# Patient Record
Sex: Female | Born: 1985 | Race: White | Hispanic: Yes | Marital: Married | Smoking: Never smoker
Health system: Southern US, Community
[De-identification: ages and names within clinical notes are randomized; demographics above are authoritative.]

## PROBLEM LIST (undated history)

## (undated) DIAGNOSIS — O24419 Gestational diabetes mellitus in pregnancy, unspecified control: Secondary | ICD-10-CM

## (undated) DIAGNOSIS — E669 Obesity, unspecified: Secondary | ICD-10-CM

## (undated) DIAGNOSIS — G43909 Migraine, unspecified, not intractable, without status migrainosus: Secondary | ICD-10-CM

## (undated) HISTORY — DX: Migraine, unspecified, not intractable, without status migrainosus: G43.909

## (undated) HISTORY — DX: Gestational diabetes mellitus in pregnancy, unspecified control: O24.419

## (undated) HISTORY — DX: Obesity, unspecified: E66.9

---

## 2009-05-27 ENCOUNTER — Encounter: Payer: Self-pay | Admitting: Family Medicine

## 2009-05-27 ENCOUNTER — Ambulatory Visit: Payer: Self-pay | Admitting: Family Medicine

## 2009-05-27 LAB — CONVERTED CEMR LAB
Basophils Absolute: 0 10*3/uL (ref 0.0–0.1)
Basophils Relative: 0 % (ref 0–1)
Hemoglobin: 11.5 g/dL — ABNORMAL LOW (ref 12.0–15.0)
Hepatitis B Surface Ag: NEGATIVE
Lymphocytes Relative: 26 % (ref 12–46)
MCHC: 32.3 g/dL (ref 30.0–36.0)
Monocytes Absolute: 0.5 10*3/uL (ref 0.1–1.0)
Neutro Abs: 4.9 10*3/uL (ref 1.7–7.7)
Neutrophils Relative %: 64 % (ref 43–77)
Platelets: 246 10*3/uL (ref 150–400)
RDW: 13.7 % (ref 11.5–15.5)
Rh Type: POSITIVE

## 2009-06-03 ENCOUNTER — Encounter: Payer: Self-pay | Admitting: Family Medicine

## 2009-06-03 ENCOUNTER — Ambulatory Visit: Payer: Self-pay | Admitting: Family Medicine

## 2009-06-03 LAB — CONVERTED CEMR LAB: Chlamydia, DNA Probe: NEGATIVE

## 2009-07-06 ENCOUNTER — Ambulatory Visit: Payer: Self-pay | Admitting: Family Medicine

## 2009-07-26 ENCOUNTER — Encounter: Payer: Self-pay | Admitting: Family Medicine

## 2009-08-05 ENCOUNTER — Ambulatory Visit: Payer: Self-pay | Admitting: Family Medicine

## 2009-08-18 ENCOUNTER — Encounter: Payer: Self-pay | Admitting: Family Medicine

## 2009-08-20 ENCOUNTER — Encounter: Payer: Self-pay | Admitting: Family Medicine

## 2009-09-07 ENCOUNTER — Ambulatory Visit: Payer: Self-pay | Admitting: Family Medicine

## 2009-09-07 ENCOUNTER — Encounter: Payer: Self-pay | Admitting: Family Medicine

## 2009-09-07 LAB — CONVERTED CEMR LAB

## 2009-09-13 ENCOUNTER — Ambulatory Visit: Payer: Self-pay | Admitting: Family Medicine

## 2009-09-13 ENCOUNTER — Encounter: Payer: Self-pay | Admitting: Family Medicine

## 2009-09-13 ENCOUNTER — Encounter: Payer: Self-pay | Admitting: *Deleted

## 2009-09-28 ENCOUNTER — Encounter: Payer: Self-pay | Admitting: Family Medicine

## 2009-09-28 ENCOUNTER — Ambulatory Visit (HOSPITAL_COMMUNITY): Admission: RE | Admit: 2009-09-28 | Discharge: 2009-09-28 | Payer: Self-pay | Admitting: Family Medicine

## 2009-09-30 ENCOUNTER — Ambulatory Visit: Payer: Self-pay | Admitting: Obstetrics & Gynecology

## 2009-10-14 ENCOUNTER — Ambulatory Visit: Payer: Self-pay | Admitting: Family Medicine

## 2009-10-28 ENCOUNTER — Ambulatory Visit: Payer: Self-pay | Admitting: Family Medicine

## 2009-11-08 ENCOUNTER — Ambulatory Visit: Payer: Self-pay | Admitting: Family Medicine

## 2009-11-17 ENCOUNTER — Ambulatory Visit: Payer: Self-pay | Admitting: Family Medicine

## 2009-11-22 ENCOUNTER — Ambulatory Visit: Payer: Self-pay | Admitting: Family Medicine

## 2009-11-24 ENCOUNTER — Inpatient Hospital Stay (HOSPITAL_COMMUNITY): Admission: AD | Admit: 2009-11-24 | Discharge: 2009-11-26 | Payer: Self-pay | Admitting: Obstetrics & Gynecology

## 2009-11-24 ENCOUNTER — Ambulatory Visit: Payer: Self-pay | Admitting: Family

## 2010-01-04 ENCOUNTER — Ambulatory Visit: Payer: Self-pay | Admitting: Family Medicine

## 2010-05-05 ENCOUNTER — Ambulatory Visit: Payer: Self-pay | Admitting: Family Medicine

## 2010-05-05 DIAGNOSIS — L03119 Cellulitis of unspecified part of limb: Secondary | ICD-10-CM

## 2010-05-05 DIAGNOSIS — L02419 Cutaneous abscess of limb, unspecified: Secondary | ICD-10-CM | POA: Insufficient documentation

## 2010-10-30 NOTE — L&D Delivery Note (Signed)
Delivery Note At 3:48 PM a viable female was delivered via Vaginal, Spontaneous Delivery (Presentation: OA ).  APGAR: 9, 9; weight 7 lb (3175 g).   Placenta status: Intact, Spontaneous.  Cord: 3 vessels with the following complications: None.   Anesthesia: None  Episiotomy: None Lacerations: None Est. Blood Loss (mL): 100  Mom to postpartum.  Baby to nursery-stable.  Alyssa [name] JEHIEL 07/26/2011, 4:10 PM

## 2010-11-29 NOTE — Assessment & Plan Note (Signed)
Summary: OB/KH   Vital Signs:  Patient profile:   25 year old female Weight:      198.31 pounds BP sitting:   112 / 77  (left arm) CC: 37 4/7 OB check Is Patient Diabetic? No Pain Assessment Patient in pain? no        CC:  37 4/7 OB check.  Habits & Providers  Alcohol-Tobacco-Diet     Tobacco Status: never     Cigarette Packs/Day: n/a  Allergies: No Known Drug Allergies   Impression & Recommendations:  Problem # 1:  SUPERVISION OF NORMAL FIRST PREGNANCY (ICD-V22.0)  Doing well.  Labor precautions and kick counts discussed.  RTC in 1 week.  Orders: Other OB visit- [facility] (OBCK)  Complete Medication List: 1)  P D Natal Vitamins/folic Acid Tabs (Prenatal multivit-min-fe-fa) .... One tab per day by mouth (ins in spanish) 2)  Colace 100 Mg Caps (Docusate sodium) .Marland Kitchen.. 1 pill by mouth 2 times a day  Patient Instructions: 1)  It was a pleasure seeing you today. 2)  Please make a follow up OB appointment in 1 week with Dr. Denyse Amass or Dr. Claudius Sis if he is not available. 3)  You are now considered "Full Term." 4)  If you have regular contractions every 5-10 minutes that continue for 1 hour or your bag of water breaks, come to the MAU at [facility]. 5)  Continue to do your kick counts (10 baby movements in 4 hours) once a day.  If your baby seems to be moving less than that, come to MAU.  Prenatal Visit Concerns noted: Patient is here today for return OB visit at 37 4/7weeks. She denies ctx, LOF or VB and reports + FM.  She c/o mild URI, denies fever.  Labor precautions discussed. GBS negative. EDC Confirmation: Ultrasound Dating Information:    Gest age by current sono: 37W 4D    EDC by current sono: 12/04/2009    Flowsheet View for Follow-up Visit    Estimated weeks of       gestation:     37 4/7    Weight:     198.31    Blood pressure:   112 / 77    Hx headache?     No    Nausea/vomiting?   No    Edema?     0    Bleeding?     no    Leakage/discharge?   d/c    Fetal activity:       yes    Labor symptoms?   no    Fundal height:      37    FHR:       140    Fetal position:      vertex    Taking Vitamins?   Y    Smoking PPD:   n/a    Next visit:     1 wk    Resident:     Claudius Sis

## 2010-11-29 NOTE — Assessment & Plan Note (Signed)
Summary: GBS,OB ,MC   Vital Signs:  Patient profile:   25 year old female Weight:      197.3 pounds Temp:     98.7 degrees F oral Pulse rate:   83 / minute BP sitting:   119 / 79  (left arm) Cuff size:   regular  Vitals Entered By: Alyssa [name] (November 08, 2009 9:38 AM) CC: OB 36-2 Is Patient Diabetic? No Pain Assessment Patient in pain? no        Primary Care Provider:  Clementeen Graham  CC:  OB 36-2.  History of Present Illness: Alyssa [name] is a 26 yo G1P0 at 36.2 weeks who presents today for a routine prenatal check.  She is doing well with no complaints.  No headache, vision change, rapid weight gain or swelling, contractions, vaginal bleeding.  She can feel the baby move. She is having normal for her small amounts of white vaginal discharge.    Please see Ob flowsheet for further details.  Habits & Providers  Alcohol-Tobacco-Diet     Tobacco Status: never     Cigarette Packs/Day: n/a  Problems Prior to Update: 1)  Supervision of Normal First Pregnancy  (ICD-V22.0) 2)  Abnormal Maternal Glucose Tolerance Antepartum  (ZOX-096.04) 3)  Fetal/placental Complication, Antepartum  (VWU-981.19) 4)  Need Prophylactic Vaccination&inoculation Flu  (ICD-V04.81) 5)  Pregnant State, Incidental  (ICD-V22.2)  Current Problems (verified): 1)  Supervision of Normal First Pregnancy  (ICD-V22.0) 2)  Abnormal Maternal Glucose Tolerance Antepartum  (JYN-829.56) 3)  Fetal/placental Complication, Antepartum  (OZH-086.57) 4)  Need Prophylactic Vaccination&inoculation Flu  (ICD-V04.81) 5)  Pregnant State, Incidental  (ICD-V22.2)  Medications Prior to Update: 1)  P D Natal Vitamins/folic Acid  Tabs (Prenatal Multivit-Min-Fe-Fa) .... One Tab Per Day By Mouth (Ins in Spanish) 2)  Colace 100 Mg Caps (Docusate Sodium) .Marland Kitchen.. 1 Pill By Mouth 2 Times A Day  Current Medications (verified): 1)  P D Natal Vitamins/folic Acid  Tabs (Prenatal Multivit-Min-Fe-Fa) .... One Tab Per Day By Mouth (Ins in  Spanish) 2)  Colace 100 Mg Caps (Docusate Sodium) .Marland Kitchen.. 1 Pill By Mouth 2 Times A Day  Allergies (verified): No Known Drug Allergies  Past History:  Past Medical History: Last updated: 09/07/2009 Pregnant, and US showing a possible subchorionic hematoma.   Past Surgical History: Last updated: 06/03/2009 none  Social History: Last updated: 06/03/2009 Live in [place] for one year from [place].  Not working.  She takes care of a baby at home.  Lives with partner who is the FOB.   Risk Factors: Smoking Status: never (11/08/2009) Packs/Day: n/a (11/08/2009)  Review of Systems        Please see Ob flowsheet for further details. Otherwise neg with the exception of HPI.  Physical Exam  General:  VS noted. Well appearing gravid woman in NAD Lungs:  CTABL Heart:  RRR no MRG Abdomen:  Gravid measuring 57m.  Nontender. Extremities:  Non edemetus BL Psych:  normally interactive, good eye contact, and not anxious appearing.   Additional Exam:   Please see Ob flowsheet for further details.   Impression & Recommendations:  Problem # 1:  SUPERVISION OF NORMAL FIRST PREGNANCY (ICD-V22.0) Assessment Unchanged 25 yo G1P0 at 36.2.  No issues at this visit.  GBS (pt has no PCN allergies), and GC/CL obtained.  Will f/u in 1 week.   Will print summery once GC/CL and GBS are back and fax to MAU.    Orders: Grp B Probe-[facility] ([id number]) GC/Chlamydia-[facility] ([id number]) Other OB visit-  [facility] (OBCK)  Complete Medication List: 1)  P D Natal Vitamins/folic Acid Tabs (Prenatal multivit-min-fe-fa) .... One tab per day by mouth (ins in spanish) 2)  Colace 100 Mg Caps (Docusate sodium) .Marland Kitchen.. 1 pill by mouth 2 times a day  Patient Instructions: 1)  Thank you for seeing me today. 2)  Cada manana y noche enfoquese en los movimientos del bebe.  Si siente por lo  menos cinco movimientos en una hora, no tiene que seguir contando.  Si no llega a cinco movimientos en una hora, siga contando por otra hora.  Si  no llega a diez movimientos en estas dos horas, debe presentarse al [employer] de [facility]gencia de [facility] o llamar a Event organiser. 3)  Si tiene sangrado vaginal, o si sospecha que se le ha roto la fuente (chorro fuerte o un goteo leve de la vagina) o si tiene contracciones que vienen mas de cada cinco minutos por dos horas seguidas, por favor presentese al Narda Rutherford de Emergencia de [facility].    [facility] for Follow-up Visit    Estimated weeks of       gestation:     36 2/7    Weight:     197.3    Blood pressure:   119 / 79    Headache:     No    Nausea/vomiting:   No    Edema:     0    Vaginal bleeding:   no    Vaginal discharge:   d/c    Fundal height:      35    FHR:       135    Fetal activity:     yes    Labor symptoms:   no    Fetal position:     vertex    Cx Dilation:     FT    Cx Effacement:   20%    Cx Station:     high    Taking prenatal vits?   Y    Smoking:     n/a    Next visit:     1 wk    Resident:     Denyse Amass    Preceptor:     Mauricio Po

## 2010-11-29 NOTE — Assessment & Plan Note (Signed)
Summary: bug bite?/eo   Vital Signs:  Patient profile:   25 year old female Weight:      199 pounds Temp:     98.0 degrees F oral Pulse rate:   82 / minute BP sitting:   119 / 78  (left arm) Cuff size:   large  Vitals Entered By: Tessie Fass CMA (May 05, 2010 10:12 AM) CC: bug bite?   Primary Provider:  Clementeen Graham  CC:  bug bite?Marland Kitchen  History of Present Illness: 1. Bug Bite- Patient with states she was bitten by bug 4 days ago on the back side of her L lower leg.  Did not see bug, but felt a pinch and sting when it occurred.  Has had gradual redness, swelling and pain over the past few days and states the area is warm to the touch.  She has had no drainage from the area.  She denies fever, chills, malaise, headaches, rash or lesions anywhere else on body.  Current Medications (verified): 1)  Sprintec 28 0.25-35 Mg-Mcg Tabs (Norgestimate-Eth Estradiol) .Marland Kitchen.. 1 By Mouth Daily (Spanish) 2)  Bactrim Ds 800-160 Mg Tabs (Sulfamethoxazole-Trimethoprim) .Marland Kitchen.. 1 Tab By Mouth Two Times A Day For 10 Days  Allergies (verified): No Known Drug Allergies  Review of Systems       Pertinent positives and negatives noted in HPI, Vitals signs noted   Physical Exam  General:  Obese hispanic female in NAD Lungs:  normal respiratory effort and normal breath sounds.   Heart:  normal rate, regular rhythm, no murmur, no gallop, and no rub.   Msk:  normal ROM, no joint tenderness, no joint swelling, and no joint warmth of L knee Skin:  Large erythematous patch on upper half of posterior calf.  There is a punctate lesion present in the middle of the area. The area is warm to the touch. There is no drainage or fluctuance.   Impression & Recommendations:  Problem # 1:  CELLULITIS, LEG, LEFT (ICD-682.6)  Erythematous area on calf concerning for cellulitis, given Rx for bactrim.  Would like to see her back in a week but patient states that since she pays out of pocket she does not want to come back if  it is getting better. Instructed to return if having fevers or chills or area is getting worse or not improving. Her updated medication list for this problem includes:    Bactrim Ds 800-160 Mg Tabs (Sulfamethoxazole-trimethoprim) .Marland Kitchen... 1 tab by mouth two times a day for 10 days  Orders: [facility]- Est Level  3 (16109)  Complete Medication List: 1)  Sprintec 28 0.25-35 Mg-mcg Tabs (Norgestimate-eth estradiol) .Marland Kitchen.. 1 by mouth daily (spanish) 2)  Bactrim Ds 800-160 Mg Tabs (Sulfamethoxazole-trimethoprim) .Marland Kitchen.. 1 tab by mouth two times a day for 10 days  Patient Instructions: 1)  Patient given verbal instructions through translator that if not getting better or getting worse to please call back to be seen again.  Also call back if having fevers or chills or drainage from area.  Patient expressed understanding of these instructions Prescriptions: BACTRIM DS 800-160 MG TABS (SULFAMETHOXAZOLE-TRIMETHOPRIM) 1 tab by mouth two times a day for 10 days  #20 x 0   Entered and Authorized by:   Everrett Coombe DO   Signed by:   Everrett Coombe DO on 05/05/2010   Method used:   Print then Give to Patient   RxID:   [id number] BACTRIM DS 800-160 MG TABS (SULFAMETHOXAZOLE-TRIMETHOPRIM) 1 tab by mouth two times a  day for 10 days  #20 x 0   Entered and Authorized by:   Everrett Coombe DO   Signed by:   Everrett Coombe DO on 05/05/2010   Method used:   Print then Give to Patient   RxID:   [id number]

## 2010-11-29 NOTE — Assessment & Plan Note (Signed)
Summary: 6 wk postpartum/kh   Vital Signs:  Patient profile:   25 year old female Weight:      187.7 pounds Temp:     98 degrees F oral Pulse rate:   75 / minute BP sitting:   105 / 68  (left arm) Cuff size:   large  Vitals Entered By: Loralee Pacas CMA (January 04, 2010 2:09 PM)  Primary Care Provider:  Clementeen Graham  CC:  Post Partum check.  History of Present Illness: Ms R-V has been doing very well since the birth of her child.  She is happy and feels a great bond between her and her infant.  She is mostly bottle feeding and is only occasionally breast feeding.  She does not think that she will continue to breastfeed. She notes that her vaginal bleeding after her birth has lessened.  She is exepriencing occasional spotting. currently.   Askes about a "bump" on her introitus.  Non-painful.  No isses.   Current Problems (verified): 1)  Pregnant State, Incidental  (ICD-V22.2)  Current Medications (verified): 1)  Sprintec 28 0.25-35 Mg-Mcg Tabs (Norgestimate-Eth Estradiol) .Marland Kitchen.. 1 By Mouth Daily (Spanish)  Allergies (verified): No Known Drug Allergies  Past History:  Past Surgical History: Last updated: 06/03/2009 none  Social History: Last updated: 06/03/2009 Live in [place] for one year from [place].  Not working.  She takes care of a baby at home.  Lives with partner who is the FOB.   Risk Factors: Smoking Status: never (11/17/2009) Packs/Day: n/a (11/22/2009)  Past Medical History: 1st pregnancy complicated by a US showing a possible subchorionic hematoma.  Which resolved prior to delivery.  Family History: Reviewed history from 06/03/2009 and no changes required. None   Social History: Reviewed history from 06/03/2009 and no changes required. Live in [place] for one year from [place].  Not working.  She takes care of a baby at home.  Lives with partner who is the FOB.   Review of Systems       Normal lochia. Otherwise negative   Physical Exam  General:  VS  noted. Well appearing NAD. Lungs:  CTABL Heart:  RRR no MRG Abdomen:  Non distended, NABS, soft, nontender, no guarding or rebound.   Genitalia:  Normal external female genitalia. Area of hyminal ring enlarged, non-erythemetus, non-tender.  When asked Ms R-V states this the area she noted. Cervix is WNL.   Uterus is WNL on bimanual exam. No adanexal masses or tenderness noted.    Impression & Recommendations:  Problem # 1:  PREGNANT STATE, INCIDENTAL (ICD-V22.2)  6 week check WNL.  Pt concerned about redundant tissue following laceration repair.   Pt would also like contraception in the form of OCPs.  She is not breastfeeding significantly.   Plan:  Sprintec for 12 months. Will see patient in 1 year or sooner if needed.  Will repeat pap smear and follow up external genitals.    Orders: Postpartum visit- [facility] (16109)  Complete Medication List: 1)  Sprintec 28 0.25-35 Mg-mcg Tabs (Norgestimate-eth estradiol) .Marland Kitchen.. 1 by mouth daily (spanish) Prescriptions: SPRINTEC 28 0.25-35 MG-MCG TABS (NORGESTIMATE-ETH ESTRADIOL) 1 by mouth daily (spanish)  #28 x 12   Entered and Authorized by:   Clementeen Graham MD   Signed by:   Clementeen Graham MD on 01/04/2010   Method used:   Print then Give to Patient   RxID:   [id number] SPRINTEC 28 0.25-35 MG-MCG TABS (NORGESTIMATE-ETH ESTRADIOL)   #28 x 12   Entered and  Authorized by:   Clementeen Graham MD   Signed by:   Clementeen Graham MD on 01/04/2010   Method used:   Print then Give to Patient   RxID:   [id number]

## 2010-11-29 NOTE — Assessment & Plan Note (Signed)
Summary: OB/KH   Vital Signs:  Patient profile:   25 year old female Weight:      199 pounds Temp:     98.6 degrees F oral Pulse rate:   63 / minute Pulse rhythm:   regular BP sitting:   116 / 76  (left arm) Cuff size:   large  Vitals Entered By: Loralee Pacas CMA (November 22, 2009 9:12 AM) CC: [facility]   Primary Care Provider:  Clementeen Graham  CC:  PNC.  History of Present Illness: Ms Robles-Vargas presents at 38.2 weeks for a normal well PNC visit.  No issues. Please see flowsheet for further detials.   Habits & Providers  Alcohol-Tobacco-Diet     Cigarette Packs/Day: n/a  Current Problems (verified): 1)  Supervision of Normal First Pregnancy  (ICD-V22.0) 2)  Abnormal Maternal Glucose Tolerance Antepartum  (ZOX-096.04) 3)  Fetal/placental Complication, Antepartum  (VWU-981.19) 4)  Need Prophylactic Vaccination&inoculation Flu  (ICD-V04.81) 5)  Pregnant State, Incidental  (ICD-V22.2)  Current Medications (verified): 1)  P D Natal Vitamins/folic Acid  Tabs (Prenatal Multivit-Min-Fe-Fa) .... One Tab Per Day By Mouth (Ins in Spanish) 2)  Colace 100 Mg Caps (Docusate Sodium) .Marland Kitchen.. 1 Pill By Mouth 2 Times A Day  Allergies (verified): No Known Drug Allergies  Past History:  Past Surgical History: Last updated: 06/03/2009 none  Social History: Last updated: 06/03/2009 Live in [place] for one year from [place].  Not working.  She takes care of a baby at home.  Lives with partner who is the FOB.   Risk Factors: Smoking Status: never (11/17/2009) Packs/Day: n/a (11/22/2009)  Past Medical History: Pregnant, and US showing a possible subchorionic hematoma.  Which is now resolved.  Physical Exam  General:  Vs noted. Well appearing in NAD please see flowsheet. Lungs:  CTABL Heart:  RRR no MRG   Impression & Recommendations:  Problem # 1:  SUPERVISION OF NORMAL FIRST PREGNANCY (ICD-V22.0) Assessment Unchanged  Well mother and fetus. Will f/u in 1 week with either another  intern or me if none are avaliable. Labor precautions given.  Orders: Other OB visit- [facility] (OBCK)  Complete Medication List: 1)  P D Natal Vitamins/folic Acid Tabs (Prenatal multivit-min-fe-fa) .... One tab per day by mouth (ins in spanish) 2)  Colace 100 Mg Caps (Docusate sodium) .Marland Kitchen.. 1 pill by mouth 2 times a day  Patient Instructions: 1)  Thank you for seeing me today. 2)  Cada manana y noche enfoquese en los movimientos del bebe.  Si siente por lo  menos cinco movimientos en una hora, no tiene que seguir contando.  Si no llega a cinco movimientos en una hora, siga contando por otra hora.  Si no llega a diez movimientos en estas dos horas, debe presentarse al [employer] de Emergencia de [facility] o llamar a Event organiser. 3)  Si tiene sangrado vaginal, o si sospecha que se le ha roto la fuente (chorro fuerte o un goteo leve de la vagina) o si tiene contracciones que vienen mas de cada cinco minutos por dos horas seguidas, por favor presentese al Narda Rutherford de Emergencia de [facility].     [facility] for Follow-up Visit    Estimated weeks of       gestation:     38 2/7    Weight:     199    Blood pressure:   116 / 76    Headache:     No    Nausea/vomiting:   No  Edema:     0    Vaginal bleeding:   no    Vaginal discharge:   d/c    Fundal height:      37    FHR:       140    Fetal activity:     yes    Labor symptoms:   no    Fetal position:     vertex    Taking prenatal vits?   Y    Smoking:     n/a    Next visit:     1 wk    Resident:     Denyse Amass    Preceptor:     [place]

## 2011-01-16 LAB — CBC
HCT: 31.9 % — ABNORMAL LOW (ref 36.0–46.0)
HCT: 36.1 % (ref 36.0–46.0)
Hemoglobin: 10.8 g/dL — ABNORMAL LOW (ref 12.0–15.0)
MCHC: 33.8 g/dL (ref 30.0–36.0)
MCHC: 34 g/dL (ref 30.0–36.0)
MCV: 94 fL (ref 78.0–100.0)
Platelets: 196 10*3/uL (ref 150–400)
Platelets: 236 10*3/uL (ref 150–400)
RDW: 12.6 % (ref 11.5–15.5)
WBC: 12.6 10*3/uL — ABNORMAL HIGH (ref 4.0–10.5)

## 2011-01-16 LAB — RPR: RPR Ser Ql: NONREACTIVE

## 2011-01-26 ENCOUNTER — Other Ambulatory Visit (INDEPENDENT_AMBULATORY_CARE_PROVIDER_SITE_OTHER): Payer: Self-pay

## 2011-01-26 ENCOUNTER — Other Ambulatory Visit: Payer: Self-pay | Admitting: Family Medicine

## 2011-01-26 DIAGNOSIS — Z3480 Encounter for supervision of other normal pregnancy, unspecified trimester: Secondary | ICD-10-CM

## 2011-01-26 NOTE — Progress Notes (Signed)
Drew pt for new ob labs. Prenatal panel, HIV, Sickle cell, and OB urine culture. AC

## 2011-01-27 ENCOUNTER — Encounter: Payer: Self-pay | Admitting: Family Medicine

## 2011-01-27 LAB — CONVERTED CEMR LAB
Basophils Absolute: 0 10*3/uL (ref 0.0–0.1)
Basophils Relative: 0 % (ref 0–1)
Eosinophils Absolute: 0.1 10*3/uL (ref 0.0–0.7)
Eosinophils Relative: 2 % (ref 0–5)
HCT: 34.5 % — ABNORMAL LOW (ref 36.0–46.0)
HIV: NONREACTIVE
Hepatitis B Surface Ag: NEGATIVE
MCV: 91.3 fL (ref 78.0–100.0)
Neutrophils Relative %: 67 % (ref 43–77)
Platelets: 252 10*3/uL (ref 150–400)
RDW: 14.5 % (ref 11.5–15.5)

## 2011-01-31 LAB — POCT URINALYSIS DIP (DEVICE)
Bilirubin Urine: NEGATIVE
Glucose, UA: NEGATIVE mg/dL
Ketones, ur: NEGATIVE mg/dL
Specific Gravity, Urine: <=1.005 (ref 1.005–1.030)
Urobilinogen, UA: 0.2 mg/dL (ref 0.0–1.0)

## 2011-02-01 LAB — GLUCOSE, CAPILLARY
Glucose-Capillary: 155 mg/dL — ABNORMAL HIGH (ref 70–99)
Glucose-Capillary: 78 mg/dL (ref 70–99)

## 2011-02-02 ENCOUNTER — Ambulatory Visit (INDEPENDENT_AMBULATORY_CARE_PROVIDER_SITE_OTHER): Payer: Self-pay | Admitting: Family Medicine

## 2011-02-02 DIAGNOSIS — Z3490 Encounter for supervision of normal pregnancy, unspecified, unspecified trimester: Secondary | ICD-10-CM

## 2011-02-02 DIAGNOSIS — Z3480 Encounter for supervision of other normal pregnancy, unspecified trimester: Secondary | ICD-10-CM

## 2011-03-01 ENCOUNTER — Ambulatory Visit (INDEPENDENT_AMBULATORY_CARE_PROVIDER_SITE_OTHER): Payer: Self-pay | Admitting: Family Medicine

## 2011-03-01 ENCOUNTER — Encounter: Payer: Self-pay | Admitting: Family Medicine

## 2011-03-01 ENCOUNTER — Other Ambulatory Visit (HOSPITAL_COMMUNITY)
Admission: RE | Admit: 2011-03-01 | Discharge: 2011-03-01 | Disposition: A | Discharge status: Home / Self Care | Payer: Self-pay | Source: Ambulatory Visit | Attending: Family Medicine | Admitting: Family Medicine

## 2011-03-01 DIAGNOSIS — Z3480 Encounter for supervision of other normal pregnancy, unspecified trimester: Secondary | ICD-10-CM

## 2011-03-01 DIAGNOSIS — Z01419 Encounter for gynecological examination (general) (routine) without abnormal findings: Secondary | ICD-10-CM | POA: Insufficient documentation

## 2011-03-01 NOTE — Progress Notes (Signed)
S: Feels well. No issues. Knows LMP dates well. Happy. Would like to go to the group prenatal class but her schedule will not allow.  O: VS noted.  Gen: Well NAD, Obese HEENT: MMM Lungs: CTABL NL WOB Heart: RRR no MRG ABD: Gravid at 18 weeks. NT Ext: Trace edema Pelvis: Normal ext genitalia, no discharge no adenexal mass. Normal cervix and vagina.   A/P: 25 yo G2P1 @ 18 weeks. Doing well. No issues this pregnancy.  1) Pregnancy: Well, will need anatomy ultrasound at Dr Elsie Stain as she does not yet have the orange card. Order is in.  Will follow up with an intern for further pregnancy care as I will be out on paternity leave during her delivery date range.  2) Obesity: Will get early 1 hr GTT before the next visit.

## 2011-03-02 LAB — GC/CHLAMYDIA PROBE AMP, GENITAL
Chlamydia, DNA Probe: NEGATIVE
GC Probe Amp, Genital: NEGATIVE

## 2011-03-15 ENCOUNTER — Encounter: Payer: Self-pay | Admitting: Family Medicine

## 2011-03-15 NOTE — Progress Notes (Signed)
Ultrasound results: Date 03-10-11 EDD 10/512 by LMP  Current GA [redacted]w[redacted]d EDD by [place] 07/30/11 Female Normal Scan

## 2011-03-17 ENCOUNTER — Other Ambulatory Visit: Payer: Self-pay | Admitting: Family Medicine

## 2011-03-17 ENCOUNTER — Other Ambulatory Visit: Payer: Self-pay

## 2011-03-17 LAB — GLUCOSE, CAPILLARY: Glucose-Capillary: 175 mg/dL — ABNORMAL HIGH (ref 70–99)

## 2011-03-17 NOTE — Progress Notes (Signed)
1 hr glucose = 175 mg/dl Dr. Mauricio Po interpreted, scheduled pt for 3 hr gtt on Tues 03-21-11 [facility], MLS

## 2011-03-21 ENCOUNTER — Other Ambulatory Visit: Payer: Self-pay

## 2011-03-21 DIAGNOSIS — Z331 Pregnant state, incidental: Secondary | ICD-10-CM

## 2011-03-21 NOTE — Progress Notes (Signed)
3 HR GTT DONE TODAY Lafaye Mcelmurry 

## 2011-03-22 LAB — GLUCOSE TOLERANCE, 3 HOURS: Glucose, GTT - 3 Hour: 107 mg/dL (ref 70–144)

## 2011-04-03 ENCOUNTER — Ambulatory Visit (INDEPENDENT_AMBULATORY_CARE_PROVIDER_SITE_OTHER): Payer: Self-pay | Admitting: Family Medicine

## 2011-04-03 ENCOUNTER — Encounter: Payer: Self-pay | Admitting: Family Medicine

## 2011-04-03 DIAGNOSIS — Z3480 Encounter for supervision of other normal pregnancy, unspecified trimester: Secondary | ICD-10-CM

## 2011-04-03 NOTE — Progress Notes (Signed)
S: Feels well. No issues. Questions about PNV brands.  In interim passed early 3 hr GTT after failing the 1hr.  O: VS noted.  Gen: Well NAD, Obese Heart/Lungs: RRR no MRG CTABL Abd: at 22cm Ext: Trace edema  A/P: 25 yo G2P1 @ 22.5 weeks. Doing well. No issues this pregnancy.  1) Pregnancy: Doing well. [place] normal. Passed 3 hr GTT. No issues currently. A+ ab neg.  2) Obesity: Gained some weight. Discussed eating habits and ways to not gain more weight with this pregnancy. Trying a lower carbohydrate approach.  Plan repeat 3hr GTT after the 26 week visit. Will schedule then.

## 2011-04-06 NOTE — Progress Notes (Signed)
Chart reviewed, I agree with Dr. Zollie Pee assessment and plan.  Of note, patient had OB US in May, to add to Dating page of EMR. Will need another GTT when in the 26-28 week window. Alyssa [name]

## 2011-05-09 ENCOUNTER — Ambulatory Visit (INDEPENDENT_AMBULATORY_CARE_PROVIDER_SITE_OTHER): Payer: Self-pay | Admitting: Family Medicine

## 2011-05-09 DIAGNOSIS — Z3480 Encounter for supervision of other normal pregnancy, unspecified trimester: Secondary | ICD-10-CM

## 2011-05-09 MED ORDER — PRENATAL RX 60-1 MG PO TABS: 1.0000 | ORAL_TABLET | Freq: Every day | ORAL | Status: AC

## 2011-05-09 NOTE — Progress Notes (Signed)
S: Feels well. No issues. Had 1 30 min episode of dizzyness 8 days ago. Sat down and eventually felt better. It was very hot outside and Ms R-V thinks that she may have been dehydrated. No CP, Palpitations during this event.    O: VS noted.  Gen: Well NAD, Obese Heart/Lungs: RRR no MRG CTABL Abd: at 28cm Ext: Non edemetous.   A/P: 25 yo G2P1 @ 22.5 weeks. Doing well. No issues this pregnancy.  1) Pregnancy: Doing well. Needs repeat 3 hr GTT in the next few weeks. (>26 weeks). A+ ab neg. F/u in 2 weeks. 2) Dizzy: Not sure of etiology. I do not think it is dangerous. Likely was some dehydration with the heat. Advised to drink lots of water and let me know if it happens again.  Will follow.

## 2011-05-24 ENCOUNTER — Other Ambulatory Visit: Payer: Self-pay

## 2011-05-24 ENCOUNTER — Ambulatory Visit (INDEPENDENT_AMBULATORY_CARE_PROVIDER_SITE_OTHER): Payer: Self-pay | Admitting: Family Medicine

## 2011-05-24 DIAGNOSIS — Z3480 Encounter for supervision of other normal pregnancy, unspecified trimester: Secondary | ICD-10-CM

## 2011-05-24 NOTE — Progress Notes (Signed)
3 hr gtt done today Alyssa [name] 

## 2011-05-24 NOTE — Progress Notes (Signed)
S: Feels well. No issues. No further dizzy episodes. Occasional headache without vision change.   O: VS noted.  Gen: Well NAD, Obese Heart/Lungs: RRR no MRG CTABL Abd: at 30cm Ext: Non edemetous.   A/P: 25 yo G2P1 @ 22.5 weeks. Doing well. No issues this pregnancy.  1) Pregnancy: Doing well. Repeat 3 hr done today. Results pending.  A+ ab neg. F/u in 2 weeks. 2) Dizzy:  Resolved.  3) Headache: Benign sounding. No red flags for preeclampsia. Recommended good hydration and tylenol prn. Discussed red flags.

## 2011-05-25 LAB — GLUCOSE TOLERANCE, 3 HOURS
Glucose Tolerance, 1 hour: 185 mg/dL (ref 70–189)
Glucose Tolerance, 2 hour: 159 mg/dL (ref 70–164)
Glucose, GTT - 3 Hour: 104 mg/dL (ref 70–144)

## 2011-05-29 ENCOUNTER — Other Ambulatory Visit: Payer: Self-pay | Admitting: Family Medicine

## 2011-05-29 ENCOUNTER — Encounter: Payer: Self-pay | Admitting: Family Medicine

## 2011-05-29 DIAGNOSIS — O24419 Gestational diabetes mellitus in pregnancy, unspecified control: Secondary | ICD-10-CM

## 2011-05-29 HISTORY — DX: Gestational diabetes mellitus in pregnancy, unspecified control: O24.419

## 2011-05-31 ENCOUNTER — Telehealth: Payer: Self-pay | Admitting: *Deleted

## 2011-05-31 NOTE — Telephone Encounter (Signed)
Faxed referral request to [facility] gestational [facility].Jakarie Pember, Rodena Medin

## 2011-06-08 ENCOUNTER — Ambulatory Visit (INDEPENDENT_AMBULATORY_CARE_PROVIDER_SITE_OTHER): Payer: Self-pay | Admitting: Family Medicine

## 2011-06-08 DIAGNOSIS — Z3480 Encounter for supervision of other normal pregnancy, unspecified trimester: Secondary | ICD-10-CM

## 2011-06-08 NOTE — Progress Notes (Signed)
S: Doing well. No pain, fevers, chills, or headaches. See flowsheet.  Interval Hx: 3 hr GTT positive for GDM. However results were sent to different provider. Referral request was faxed to [facility] however the patient has not heard anything yet.  O: BP 126/75  Wt 216 lb 12.8 oz (98.34 kg)  LMP 10/26/2010  Breastfeeding? Unknown GEN: Obese well NAD ABD: Measures 32 cm todate A/P 25 yo f at 32.1 weeks.  1) Pregnancy: Doing well no contractions or headaches. As GDM dx will ensure she is seen at [facility] for care. I am concerned that we have not heard anything yet. Will follow closely. If no [facility] appt will see back in 2 weeks.  2) GDM: As above. Will be sending to [facility] clinic for further care. Appt Aug 13th  3) Contraception: Desires IUD Heritage manager given.  4) Feeding: Plans breast and bottle. 5) Peds f/u: At [facility]

## 2011-06-12 ENCOUNTER — Encounter: Payer: Self-pay | Attending: Obstetrics & Gynecology | Admitting: Dietician

## 2011-06-12 ENCOUNTER — Ambulatory Visit (INDEPENDENT_AMBULATORY_CARE_PROVIDER_SITE_OTHER): Payer: Self-pay | Admitting: Family Medicine

## 2011-06-12 DIAGNOSIS — O24419 Gestational diabetes mellitus in pregnancy, unspecified control: Secondary | ICD-10-CM

## 2011-06-12 DIAGNOSIS — O99810 Abnormal glucose complicating pregnancy: Secondary | ICD-10-CM | POA: Insufficient documentation

## 2011-06-12 DIAGNOSIS — Z713 Dietary counseling and surveillance: Secondary | ICD-10-CM | POA: Insufficient documentation

## 2011-06-12 DIAGNOSIS — O09899 Supervision of other high risk pregnancies, unspecified trimester: Secondary | ICD-10-CM

## 2011-06-12 LAB — POCT URINALYSIS DIP (DEVICE)
Glucose, UA: 100 mg/dL — AB
Nitrite: NEGATIVE
Urobilinogen, UA: 0.2 mg/dL (ref 0.0–1.0)

## 2011-06-12 MED ORDER — CEPHALEXIN 500 MG PO CAPS: 500.0000 mg | ORAL_CAPSULE | Freq: Two times a day (BID) | ORAL | Status: AC

## 2011-06-12 NOTE — Progress Notes (Signed)
Pressure-pelvic, no vaginal discharge

## 2011-06-12 NOTE — Progress Notes (Signed)
Gestational Diabetes Education and Meter Instruction; Completed GDM diet instruction.  Provided handouts in Spanish:Nutrition, Diabetes, and Pregnancy and Novo Nordisk Carbohydrate Counting in [place].  Provided a True Track meter, Lot # [id number] Expiration:02/17/2012, provided 1box strips [id number] Expiration 12/26/2012, 1 box  Lancets Lot: [id number], Expiration:10/02/2015.  On return demo. Her glucose level was 110 after breakfast .  She is to bring her meter and log book to every clinic appointment.  Maggie Torrance Frech, RN, RD, CDE

## 2011-06-12 NOTE — Progress Notes (Signed)
Pt seen.  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  Does not have glucometer and has not been checking CBG, nor has seen dietician.  Pt to see Maggie today.  Mod leuk on UA.  Asymptomatic.  Will send culture and start keflex.  RTC in 1 week with log book.

## 2011-06-17 LAB — URINE CULTURE: Colony Count: >=100000

## 2011-06-19 ENCOUNTER — Ambulatory Visit (INDEPENDENT_AMBULATORY_CARE_PROVIDER_SITE_OTHER): Payer: Self-pay | Admitting: Obstetrics & Gynecology

## 2011-06-19 ENCOUNTER — Encounter: Payer: Self-pay | Admitting: Dietician

## 2011-06-19 VITALS — BP 116/73 | HR 87 | Temp 97.1°F | Wt 214.4 lb

## 2011-06-19 DIAGNOSIS — O99810 Abnormal glucose complicating pregnancy: Secondary | ICD-10-CM

## 2011-06-19 DIAGNOSIS — O24419 Gestational diabetes mellitus in pregnancy, unspecified control: Secondary | ICD-10-CM

## 2011-06-19 LAB — POCT URINALYSIS DIP (DEVICE)
Bilirubin Urine: NEGATIVE
Ketones, ur: NEGATIVE mg/dL
Nitrite: NEGATIVE
pH: 7 (ref 5.0–8.0)

## 2011-06-19 NOTE — Progress Notes (Signed)
Nutrition Note: Referred for Nutrition/GDM Diet Review Obese, WIC Client with GDM.  Weight gain of 14.6# plots within normal limits for weeks gest. Patient reports intake of 5 meals/24 hours starting at 9:00am eats q 3 hrs. Also reports intake of fruits and other snacks lacking a protein source.  Review CHO/protein intake at every meal and increased physical activity as able.  Follow up if referred. Cy Blamer, RD

## 2011-06-19 NOTE — Progress Notes (Signed)
06/19/2011 Provided 1 box of strips today.  Maggie Izadora Roehr, RN, RD, CDE

## 2011-06-19 NOTE — Progress Notes (Signed)
lg leukocytes, culture ordered. BG fasting100-114, post breakfast 104-136, p lunch 98-140, p supper.  98-140 Consider glyburide if FBS not better next week, nutrition counseling today

## 2011-06-21 LAB — CULTURE, URINE COMPREHENSIVE: Colony Count: NO GROWTH

## 2011-06-26 ENCOUNTER — Other Ambulatory Visit: Payer: Self-pay | Admitting: Obstetrics & Gynecology

## 2011-06-26 ENCOUNTER — Ambulatory Visit (INDEPENDENT_AMBULATORY_CARE_PROVIDER_SITE_OTHER): Payer: Self-pay | Admitting: Family Medicine

## 2011-06-26 ENCOUNTER — Ambulatory Visit (HOSPITAL_COMMUNITY)
Admission: RE | Admit: 2011-06-26 | Discharge: 2011-06-26 | Disposition: A | Discharge status: Home / Self Care | Payer: Self-pay | Source: Ambulatory Visit | Attending: Obstetrics & Gynecology | Admitting: Obstetrics & Gynecology

## 2011-06-26 ENCOUNTER — Other Ambulatory Visit: Payer: Self-pay

## 2011-06-26 VITALS — BP 96/63 | Temp 98.5°F | Wt 212.6 lb

## 2011-06-26 DIAGNOSIS — O99810 Abnormal glucose complicating pregnancy: Secondary | ICD-10-CM | POA: Insufficient documentation

## 2011-06-26 DIAGNOSIS — O24419 Gestational diabetes mellitus in pregnancy, unspecified control: Secondary | ICD-10-CM

## 2011-06-26 LAB — POCT URINALYSIS DIP (DEVICE)
Glucose, UA: NEGATIVE mg/dL
Nitrite: NEGATIVE
Urobilinogen, UA: 0.2 mg/dL (ref 0.0–1.0)
pH: 7 (ref 5.0–8.0)

## 2011-06-26 MED ORDER — GLYBURIDE 2.5 MG PO TABS: 2.5000 mg | ORAL_TABLET | Freq: Every day | ORAL | Status: DC

## 2011-06-26 NOTE — Progress Notes (Signed)
Pulse 70. Pelvic pressure. No vaginal discharge. Used interpreter Delorise Royals.

## 2011-06-26 NOTE — Progress Notes (Signed)
(  F) 93-100, (B) 88-132, (L) 102-136 (D) 113-127  Started Glyburide 2.5mg  QHS.  RTC 1 week.  Twice weekly NSTs.

## 2011-07-06 ENCOUNTER — Ambulatory Visit (INDEPENDENT_AMBULATORY_CARE_PROVIDER_SITE_OTHER): Payer: Self-pay | Admitting: Family Medicine

## 2011-07-06 ENCOUNTER — Other Ambulatory Visit: Payer: Self-pay | Admitting: Family Medicine

## 2011-07-06 ENCOUNTER — Encounter: Payer: Self-pay | Admitting: Family Medicine

## 2011-07-06 VITALS — BP 105/62 | Temp 98.2°F | Wt 211.6 lb

## 2011-07-06 DIAGNOSIS — O99810 Abnormal glucose complicating pregnancy: Secondary | ICD-10-CM

## 2011-07-06 DIAGNOSIS — O24419 Gestational diabetes mellitus in pregnancy, unspecified control: Secondary | ICD-10-CM

## 2011-07-06 LAB — POCT URINALYSIS DIP (DEVICE)
Bilirubin Urine: NEGATIVE
Nitrite: NEGATIVE
Protein, ur: NEGATIVE mg/dL
pH: 6.5 (ref 5.0–8.0)

## 2011-07-06 NOTE — Progress Notes (Signed)
Pressure-pelvic. Contractions started yesterday x 3.  No vaginal discharge.

## 2011-07-06 NOTE — Progress Notes (Signed)
FBS-70-95, 2 hr pp 81-122, most in range. Cx's today Subjective:    Alyssa [name] is a 25 y.o. G2P1001 [redacted]w[redacted]d being seen today for her obstetrical visit.  Patient reports no complaints. Fetal movement: normal.  Objective:    BP 105/62  Temp 98.2 F (36.8 C)  Wt 211 lb 9.6 oz (95.981 kg)  LMP 10/26/2010  Breastfeeding? Unknown  Physical Exam  Exam  FHT:  150 BPM  Uterine Size: 36 cm  Presentation: cephalic     Assessment:    Pregnancy:  G2P1001    Plan:    Patient Active Problem List  Diagnoses  . Gestational diabetes    cx's done Follow up in twice weekly.

## 2011-07-06 NOTE — Patient Instructions (Signed)
Gestational Diabetes Mellitus (GDM) Gestational diabetes mellitus (GDM) is diabetes that occurs only during pregnancy. This happens when the body cannot properly handle the glucose (sugar) that increases in the blood after eating. During pregnancy, insulin resistance (reduced sensitivity to insulin) occurs because of the release of hormones from the placenta. Usually, the pancreas of pregnant women produces enough insulin to overcome the resistance that occurs. However, in gestational diabetes, the insulin is there but it does not work effectively. If the resistance is severe enough that the pancreas does not produce enough insulin, extra glucose builds up in the blood.  WHO IS AT RISK FOR DEVELOPING GESTATIONAL DIABETES?  Women with a history of diabetes in the family.   Women over age 25.   Women who are overweight.   Women in certain ethnic groups (Hispanic, African American, Native American, Asian and Pacific Islander).  WHAT CAN HAPPEN TO THE BABY? If the mother's blood glucose is too high while she is pregnant, the extra sugar will travel through the umbilical cord to the baby. Some of the problems the baby may have are:  Large Baby - If the baby receives too much sugar, the baby will gain more weight. This may cause the baby to be too large to be born normally (vaginally) and a Cesarean section (C-section) may be needed.   Low Blood Glucose (hypoglycemia) - The baby makes extra insulin, in response to the extra sugar its gets from its mother. When the baby is born and no longer needs this extra insulin, the baby's blood glucose level may drop.   Jaundice (yellow coloring of the skin and eyes) - This is fairly common in babies. It is caused from a build-up of the chemical called bilirubin. This is rarely serious, but is seen more often in babies whose mothers had gestational diabetes.  RISKS TO THE MOTHER Women who have had gestational diabetes may be at higher risk for some problems,  including:  Preeclampsia or toxemia, which includes problems with high blood pressure. Blood pressure and protein levels in the urine must be checked frequently.   Infections.   Cesarean section (C-section) for delivery.   Developing Type 2 diabetes later in life. About 30-50% will develop diabetes later, especially if obese.  DIAGNOSIS The hormones that cause insulin resistance are highest at about 24-28 weeks of pregnancy. If symptoms are experienced, they are much like symptoms you would normally expect during pregnancy.  GDM is often diagnosed using a two part method: 1. After 24-28 weeks of pregnancy, the woman drinks a glucose solution and takes a blood test. If the glucose level is high, a second test will be given.  2. Oral Glucose Tolerance Test (OGTT) which is 3 hours long - After not eating overnight, the blood glucose is checked. The woman drinks a glucose solution, and hourly blood glucose tests are taken.  If the woman has risk factors for GDM, the caregiver may test earlier than 24 weeks of pregnancy. TREATMENT Treatment of GDM is directed at keeping the mother's blood glucose level normal, and may include:  Meal planning.   Taking insulin or other medicine to control your blood glucose level.   Exercise.   Keeping a daily record of the foods you eat.   Blood glucose monitoring and keeping a record of your blood glucose levels.   May monitor ketone levels in the urine, although this is no longer considered necessary in most pregnancies.  HOME CARE INSTRUCTIONS While you are pregnant:  Follow your   caregiver's advice regarding your prenatal appointments, meal planning, exercise, medicines, vitamins, blood and other tests, and physical activities.   Keep a record of your meals, blood glucose tests, and the amount of insulin you are taking (if any). Show this to your caregiver at every prenatal visit.   If you have GDM, you may have problems with hypoglycemia (low blood  glucose). You may suspect this if you become suddenly dizzy, feel shaky, and/or weak. If you think this is happening and you have a glucose meter, try to test your blood glucose level. Follow your caregiver's advice for when and how to treat your low blood glucose. Generally, the 15:15 rule is followed: Treat by consuming 15 grams of carbohydrates, wait 15 minutes, and recheck blood glucose. Examples of 15 grams of carbohydrates are:   1 cup skim or low fat milk.    cup juice.   3-4 glucose tablets.   5-6 hard candies.   1 small box raisins.    cup regular soda pop.   Practice good hygiene, to avoid infections.   Do not smoke.  SEEK MEDICAL CARE IF:  You develop abnormal vaginal discharge, with or without itching.   You become weak and tired more than expected.   You seem to sweat a lot.   You have a sudden increase in weight, 5 pounds or more in one week.   You are losing weight, 3 pounds or more in a week.   Your blood glucose level is high, and you need instructions on what to do about it.  SEEK IMMEDIATE MEDICAL CARE IF:  You develop a severe headache.   You faint or pass out.   You develop nausea and vomiting.   You become disoriented or confused.   You have a convulsion.   You develop vision problems.   You develop stomach pain.   You develop vaginal bleeding.   You develop uterine contractions.   You have leaking or a gush of fluid from the vagina.  AFTER YOU HAVE THE BABY:  Go to all of your follow-up appointments, and have blood tests as advised by your caregiver.   Maintain a healthy lifestyle, to prevent diabetes in the future. This includes:   Following a healthy meal plan.   Controlling your weight.   Getting enough exercise and proper rest.   Do not smoke.   Breastfeed your baby if you can. This will lower the chance of you and your baby developing diabetes later in life.  For more information about diabetes, go to the American  Diabetes Association at: www.americandiabetesassociation.org. For more information about gestational diabetes, go to the American Congress of Obstetricians and Gynecologists at: www.acog.org. Document Released: 01/22/2001 Document Re-Released: 10/04/2009 ExitCare Patient Information 2011 [employer]. 

## 2011-07-06 NOTE — Assessment & Plan Note (Signed)
Cont. Plan

## 2011-07-07 LAB — GC/CHLAMYDIA PROBE AMP, GENITAL: GC Probe Amp, Genital: NEGATIVE

## 2011-07-09 LAB — CULTURE, BETA STREP (GROUP B ONLY)

## 2011-07-10 ENCOUNTER — Other Ambulatory Visit: Payer: Self-pay | Admitting: Family Medicine

## 2011-07-10 ENCOUNTER — Ambulatory Visit (INDEPENDENT_AMBULATORY_CARE_PROVIDER_SITE_OTHER): Payer: Self-pay | Admitting: Advanced Practice Midwife

## 2011-07-10 VITALS — BP 104/68 | Temp 97.2°F | Wt 213.4 lb

## 2011-07-10 DIAGNOSIS — O24419 Gestational diabetes mellitus in pregnancy, unspecified control: Secondary | ICD-10-CM

## 2011-07-10 DIAGNOSIS — O99810 Abnormal glucose complicating pregnancy: Secondary | ICD-10-CM

## 2011-07-10 LAB — POCT URINALYSIS DIP (DEVICE)
Glucose, UA: NEGATIVE mg/dL
Ketones, ur: NEGATIVE mg/dL
Protein, ur: NEGATIVE mg/dL
Specific Gravity, Urine: 1.02 (ref 1.005–1.030)
Urobilinogen, UA: 0.2 mg/dL (ref 0.0–1.0)

## 2011-07-10 NOTE — Patient Instructions (Signed)
Psychiatrist Glass blower/designer trimestre) (Pregnancy - Third Trimester) El tercer trimestre del embarazo (los ltimos 3 meses) es el perodo de cambios ms rpidos que atraviesan usted y el beb. El aumento de peso es ms rpido. El beb alcanza un largo de aproximadamente 50 cm (20 pulgadas) y pesa entre 2,700 y 4,500 kg (6 a 10 libras). El beb gana ms tejido graso y ya est listo para la vida fuera del cuerpo de la [place]. Mientras estn en el interior, los bebs tienen perodos de sueo y vigilia, Warehouse manager y tienen hipo. Quizs sienta pequeas contracciones del tero. Este es el falso trabajo de [place]. Tambin se las conoce como contracciones de [place]. Es como una prctica del parto. Los problemas ms habituales de esta etapa del embarazo incluyen mayor dificultad para respirar, hinchazn de las manos y los pies por retencin de lquidos y la necesidad de Geographical information systems officer con ms frecuencia debido a que el tero y el beb presionan sobre la vejiga.  EXAMENES PRENATALES  Durante los [employer], deber seguir realizando pruebas de [place], segn avance el [place]. Estas pruebas se realizan para controlar su salud y la del beb. Tambin se realizan anlisis de sangre para [employer] niveles de [place]. La anemia (bajo nivel de hemoglobina) es frecuente durante el embarazo. Para prevenirla, se administran hierro y vitaminas. Tambin le harn nuevas pruebas para descartar la diabetes. Podrn repetirle algunas de las H[employer] Enterprises hicieron previamente.   En cada visita le medirn el tamao del tero. Es para asegurarse de que el beb se desarrolla correctamente.   Tambin en cada visita la pesarn. Esto se realiza para asegurarse de que aumenta de peso al ritmo indicado y que usted y su beb evolucionan normalmente.   En algunas ocasiones se realiza una ecografa para confirmar el correcto desarrollo y evolucin del beb. Esta prueba se realiza con ondas sonoras inofensivas para el beb, de modo  que el profesional pueda calcular con ms precisin la fecha del [place].   Discuta las posibilidades de la anestesia si necesita cesrea.  Algunas veces se realizan pruebas especializadas del lquido amnitico que rodea al beb. Esta prueba se denomina amniocentesis. El lquido amnitico se obtiene introduciendo una aguja en el abdomen (vientre). En ocasiones se lleva a cabo cerca del final del embarazo, si es Optician, dispensing. En este caso se realiza para asegurarse de que los pulmones del beb estn lo suficientemente maduros como para que pueda vivir fuera del tero. CAMBIOS QUE OCURREN EN EL TERCER TRIMESTRE DEL EMBARAZO Su organismo atravesar diferentes cambios durante el embarazo que varan de Neomia Dear persona a Educational psychologist. Converse con el profesional que la asiste acerca los cambios que usted note y que la preocupen.  Durante el ltimo trimestre probablemente sienta un aumento del apetito. Es normal tener "antojos" de Development worker, community. Esto vara de Neomia Dear persona a otra y de un embarazo a Therapist, art.   Podrn aparecer las primeras estras en las caderas, abdomen y [place]. Estos son cambios normales del cuerpo durante el [place]. No existen medicamentos ni ejercicios que puedan prevenir [employer].   El estreimiento puede tratarse con un laxante o agregando fibra a su dieta. Beber grandes cantidades de lquidos, tomar fibras en forma de verduras, frutas y granos integrales es de [place].   Tambin es beneficioso practicar actividad fsica. Si ha sido una persona Engineer, mining, podr continuar con la [employer]son de las actividades durante el mismo. Si ha sido [employer], puede ser beneficioso que  comience con un programa de ejercicios, como Scientist, clinical (histocompatibility and immunogenetics). Consulte con el profesional que la asiste antes de comenzar un programa de ejercicios.   Evite el consumo de cigarrillos, el alcohol, los medicamentos no prescritos y las "drogas de la calle" durante el [place]. Estas sustancias  qumicas afectan la formacin y el desarrollo del beb. Evite estas sustancias durante todo el embarazo para asegurar el nacimiento de un beb sano.   Dolor de espalda, venas varicosas y hemorroides podran aparecer o empeorar.   Los movimientos del beb pueden ser ms bruscos y aparecer ms a menudo.   Puede que note dificultades para respirar facilmente.   El ombligo podra salrsele hacia afuera.   Puede segregar un lquido amarillento (calostro) de las [place].   Puede segregar mucus con sangre. Esto normalmente ocurre unos [address] a una semana antes de que comience el [place] de [place].  INSTRUCCIONES PARA EL CUIDADO DOMICILIARIO  La mayor parte de los cuidados que se aconsejan son los mismos que los indicados para las primeras etapas del Psychiatrist. Es importante que concurra a todas las citas con el profesional y siga sus instrucciones con Camera operator a los medicamentos que deba Chemical engineer, a la actividad fsica y a Psychologist, forensic.   Durante el embarazo debe obtener nutrientes para usted y para su beb. Consuma alimentos balanceados a intervalos regulares. Elija alimentos como carne, pescado, [place] y otros productos lcteos descremados, verduras, frutas, panes integrales y cereales. El Equities trader cul es el aumento de peso ideal.   Las relaciones sexuales pueden continuarse hasta casi el final del embarazo, si no se presentan otros problemas como prdida prematura (antes de tiempo) de lquido amnitico, hemorragia vaginal o dolor abdominal (en el vientre).   Realice Tes[employer] Corporation, si no tiene restricciones. Consulte con el profesional que la asiste si no sabe con certeza si determinados ejercicios son seguros. El mayor aumento de peso se produce [employer]ocker ltimos trimestres del Olivia Lopez de [place].   Haga reposo con frecuencia, con las piernas elevadas, o segn lo necesite para evitar los calambres y el dolor de cintura.   Use un buen sostn o como los que se usan para hacer  deportes para Paramedic la sensibilidad de las [place]. Tambin puede serle til si lo [place] mientras duerme. Si pierde Product manager, podr [employer]in.   No utilice la baera con agua caliente, baos turcos y saunas.   Colquese el cinturn de seguridad cuando conduzca. Este la proteger a usted y al beb en caso de accidente.   Evite comer carne cruda y el contacto con los utensilios y desperdicios de los gatos. Estos elementos contienen grmenes que pueden causar defectos de nacimiento en el beb.   Es fcil perder algo de orina durante el [place]. Apretar y Chief Operating Officer los msculos de la pelvis la ayudar con este problema. Practique detener la miccin cuando est en el bao. Estos son los mismos msculos que Development worker, international aid. Son [employer] mismos msculos que utiliza cuando trata de [employer]der System gases. Puede practicar apretando estos msculos WellPoint, y repetir esto tres veces por da aproximadamente. Una vez que conozca qu msculos debe contraer, no realice estos ejercicios durante la miccin. Puede favorecerle una infeccin si la orina vuelve hacia atrs.   Pida ayuda si tiene necesidades econmicas, de asesoramiento o nutricionales durante el [place]. El profesional podr ayudarla con respecto a estas necesidades, o derivarla a otros especialistas.   Practique la ida [employer] hospital a modo de  prueba.   Tome clases prenatales junto con su pareja para comprender, practicar y hacer preguntas acerca del Aleen Campi de parto y el nacimiento.   Prepare la habitacin del beb.   No viaje fuera de la ciudad a menos que sea absolutamente necesario y con el consejo del mdico.   Use slo zapatos bajos sin taco para tener un mejor equilibrio y prevenir cadas.  EL CONSUMO DE MEDICAMENTOS Y DROGAS DURANTE EL EMBARAZO  Contine tomando las vitaminas apropiadas para esta etapa tal como se le indic. Las vitaminas deben contener un miligramo de cido flico y deben suplementarse con  hierro. Guarde todas las vitaminas fuera del alcance de los nios. La ingestin de slo un par de vitaminas o comprimidos que contengan hierro pueden ocasionar la [employer] Mining en un beb o en un nio pequeo.   Evite el uso de [place], inclusive los de venta [place]sie, que no hayan sido prescritos o indicados por el profesional que la asiste. Algunos medicamentos pueden causar problemas fsicos al beb. Utilice los medicamentos de venta libre o de prescripcin para Chief Technology Officer, Environmental health practitioner o la [place], segn se lo indique el profesional que lo asiste. No utilice aspirina, ibuprofeno (Motrin, Advil, Nuprin) o naproxeno (Aleve) a menos que el profesional la autorice.   El alcohol se asocia a cierto nmero de defectos del nacimiento, incluido el sndrome de alcoholismo fetal. Debe evitar el consumo de alcohol en cualquiera de sus formas. El cigarrillo causa nacimientos prematuros y bebs de bajo peso al nacer. Las drogas de la calle son muy nocivas para el beb y estn absolutamente prohibidas. Un beb que nace de [employer] Express, ser adicto al nacer. Ese beb tendr los mismos sntomas de abstinencia que un adulto.   Infrmele al profesional si consume alguna droga.  SOLICITE ATENCIN MDICA SI: Tiene alguna preocupacin Academic librarian. Es mejor que llame para formular las preguntas si no puede esperar hasta la prxima visita, que sentirse preocupada por ellas.  DECISIONES ACERCA DE LA CIRCUNCISIN Usted puede saber o no cul es el sexo de su beb. Si es un varn, ste es el momento de pensar acerca de la circuncisin. La circuncisin es la extirpacin del prepucio. Esta es la piel que cubre el extremo sensible del pene. No hay un motivo mdico que lo justifique. Generalmente la decisin se toma segn lo que sea popular en ese momento, o se basa en creencias religiosas. Podr conversar estos temas con el profesional que la asiste. SOLICITE ATENCIN MDICA DE INMEDIATO SI:  La temperatura oral se eleva  sin motivo por encima de 101.0 o segn le indique el profesional que la asiste.   Tiene una prdida de lquido por la vagina (canal de parto). Si sospecha una ruptura de las [place], tmese la temperatura y llame al profesional para informarlo sobre esto.   Observa unas pequeas manchas, una hemorragia vaginal o elimina cogulos. Avsele al profesional acerca de la cantidad y de cuntos apsitos est utilizando.   Presenta un olor desagradable en la secrecin vaginal y observa un cambio en el color, de transparente a blanco.   Ha vomitado durante ms de 24 horas.   Presenta escalofros o fiebre.   Comienza a sentir falta de aire.   Siente ardor al Beatrix Shipper.   [place] o sube ms de 900 g (ms de 2 libras), o segn lo indicado por el profesional que la asiste. Observa que sbitamente se le hinchan el rostro, las manos, los pies o las piernas.   Presenta  dolor abdominal. Las molestias en el ligamento redondo son Neomia Dear causa benigna (no cancerosa) frecuente de Engineer, mining abdominal durante el Psychiatrist, pero el profesional que la asiste deber evaluarlo.   Presenta dolor de cabeza intenso que no se [place].   Si no siente los movimientos del beb durante ms de tres horas. Si piensa que el beb no se mueve tanto como lo haca habitualmente, coma algo que Psychologist, clinical y [employer] Corporation lado izquierdo durante [place]. El beb debe moverse al menos 4  5 veces por hora. Comunquese inmediatamente si el beb se mueve menos que lo indicado.   Se cae, se ve involucrada en un accidente automovilstico o sufre algn tipo de traumatismo.   En su hogar hay violencia mental o fsica.  Document Released: 07/26/2005 Document Re-Released: 08/13/2009 [facility] Patient Information 2011 [place], [place].

## 2011-07-10 NOTE — Progress Notes (Signed)
Pulse 71. NO vaginal discharge. Used interpreter [employer].

## 2011-07-10 NOTE — Progress Notes (Signed)
No c/o. Reactive NST, AFI 13. FBS 73-87, PP 91-123.

## 2011-07-13 ENCOUNTER — Ambulatory Visit (INDEPENDENT_AMBULATORY_CARE_PROVIDER_SITE_OTHER): Payer: Self-pay | Admitting: Obstetrics & Gynecology

## 2011-07-13 VITALS — BP 93/55

## 2011-07-13 DIAGNOSIS — O99810 Abnormal glucose complicating pregnancy: Secondary | ICD-10-CM

## 2011-07-13 DIAGNOSIS — O24419 Gestational diabetes mellitus in pregnancy, unspecified control: Secondary | ICD-10-CM

## 2011-07-13 NOTE — Progress Notes (Signed)
NST performed on 07/13/2011 was reviewed and was found to be reactive.  Continue recommended antenatal testing.  ANYANWU,UGONNA A 07/13/2011 5:41 PM

## 2011-07-13 NOTE — Progress Notes (Signed)
Here for nst.

## 2011-07-17 ENCOUNTER — Ambulatory Visit (HOSPITAL_COMMUNITY)
Admission: RE | Admit: 2011-07-17 | Discharge: 2011-07-17 | Disposition: A | Discharge status: Home / Self Care | Payer: Self-pay | Source: Ambulatory Visit | Attending: Obstetrics and Gynecology | Admitting: Obstetrics and Gynecology

## 2011-07-17 ENCOUNTER — Ambulatory Visit (INDEPENDENT_AMBULATORY_CARE_PROVIDER_SITE_OTHER): Payer: Self-pay | Admitting: Obstetrics and Gynecology

## 2011-07-17 ENCOUNTER — Other Ambulatory Visit: Payer: Self-pay | Admitting: Obstetrics and Gynecology

## 2011-07-17 VITALS — BP 114/66 | Temp 98.3°F | Wt 215.2 lb

## 2011-07-17 DIAGNOSIS — O24419 Gestational diabetes mellitus in pregnancy, unspecified control: Secondary | ICD-10-CM

## 2011-07-17 DIAGNOSIS — O288 Other abnormal findings on antenatal screening of mother: Secondary | ICD-10-CM

## 2011-07-17 DIAGNOSIS — O99810 Abnormal glucose complicating pregnancy: Secondary | ICD-10-CM

## 2011-07-17 DIAGNOSIS — Z3689 Encounter for other specified antenatal screening: Secondary | ICD-10-CM | POA: Insufficient documentation

## 2011-07-17 LAB — POCT URINALYSIS DIP (DEVICE)
Glucose, UA: NEGATIVE mg/dL
Nitrite: NEGATIVE
Specific Gravity, Urine: 1.025 (ref 1.005–1.030)
Urobilinogen, UA: 1 mg/dL (ref 0.0–1.0)

## 2011-07-17 NOTE — Progress Notes (Signed)
CBG fasting and postprandial well controlled. NST reviewed and non-reactive will order BPP. F/U growth [place] next week. Will schedule IOL at 39 wk on 9/26. FM/labor precautions reviewed.

## 2011-07-20 ENCOUNTER — Ambulatory Visit (HOSPITAL_COMMUNITY)
Admission: RE | Admit: 2011-07-20 | Discharge: 2011-07-20 | Disposition: A | Discharge status: Home / Self Care | Payer: Self-pay | Source: Ambulatory Visit | Attending: Obstetrics and Gynecology | Admitting: Obstetrics and Gynecology

## 2011-07-20 ENCOUNTER — Ambulatory Visit (INDEPENDENT_AMBULATORY_CARE_PROVIDER_SITE_OTHER): Payer: Self-pay | Admitting: *Deleted

## 2011-07-20 VITALS — BP 109/61

## 2011-07-20 DIAGNOSIS — O99810 Abnormal glucose complicating pregnancy: Secondary | ICD-10-CM

## 2011-07-20 DIAGNOSIS — O24419 Gestational diabetes mellitus in pregnancy, unspecified control: Secondary | ICD-10-CM

## 2011-07-20 DIAGNOSIS — Z3689 Encounter for other specified antenatal screening: Secondary | ICD-10-CM | POA: Insufficient documentation

## 2011-07-20 NOTE — Progress Notes (Signed)
P = 79  NST only today.  [place] growth following NST

## 2011-07-24 ENCOUNTER — Other Ambulatory Visit: Payer: Self-pay

## 2011-07-24 ENCOUNTER — Telehealth (HOSPITAL_COMMUNITY): Payer: Self-pay | Admitting: *Deleted

## 2011-07-24 ENCOUNTER — Encounter (HOSPITAL_COMMUNITY): Payer: Self-pay | Admitting: *Deleted

## 2011-07-24 NOTE — Telephone Encounter (Signed)
Preadmission screen  

## 2011-07-25 ENCOUNTER — Encounter (HOSPITAL_COMMUNITY): Payer: Self-pay | Admitting: *Deleted

## 2011-07-25 ENCOUNTER — Telehealth (HOSPITAL_COMMUNITY): Payer: Self-pay | Admitting: *Deleted

## 2011-07-25 NOTE — Telephone Encounter (Signed)
Add info

## 2011-07-26 ENCOUNTER — Inpatient Hospital Stay (HOSPITAL_COMMUNITY)
Admission: RE | Admit: 2011-07-26 | Discharge: 2011-07-28 | DRG: 774 | Disposition: A | Discharge status: Home / Self Care | Payer: Medicaid Other | Source: Ambulatory Visit | Attending: Family Medicine | Admitting: Family Medicine

## 2011-07-26 ENCOUNTER — Encounter (HOSPITAL_COMMUNITY): Payer: Self-pay

## 2011-07-26 DIAGNOSIS — E119 Type 2 diabetes mellitus without complications: Secondary | ICD-10-CM | POA: Diagnosis present

## 2011-07-26 DIAGNOSIS — O2432 Unspecified pre-existing diabetes mellitus in childbirth: Principal | ICD-10-CM | POA: Diagnosis present

## 2011-07-26 LAB — CBC
HCT: 34.6 % — ABNORMAL LOW (ref 36.0–46.0)
MCV: 89.6 fL (ref 78.0–100.0)
Platelets: 229 10*3/uL (ref 150–400)
RBC: 3.86 MIL/uL — ABNORMAL LOW (ref 3.87–5.11)
RDW: 13.3 % (ref 11.5–15.5)
WBC: 7 10*3/uL (ref 4.0–10.5)

## 2011-07-26 LAB — GLUCOSE, CAPILLARY: Glucose-Capillary: 83 mg/dL (ref 70–99)

## 2011-07-26 MED ORDER — IBUPROFEN 600 MG PO TABS: 600.0000 mg | ORAL_TABLET | Freq: Four times a day (QID) | ORAL | Status: DC | PRN

## 2011-07-26 MED ORDER — OXYTOCIN 20 UNITS IN LACTATED RINGERS INFUSION - SIMPLE
125.0000 mL/h | Freq: Once | INTRAVENOUS | Status: AC
  Administered 2011-07-26: 500 mL/h via INTRAVENOUS

## 2011-07-26 MED ORDER — SIMETHICONE 80 MG PO CHEW: 80.0000 mg | CHEWABLE_TABLET | ORAL | Status: DC | PRN

## 2011-07-26 MED ORDER — BENZOCAINE-MENTHOL 20-0.5 % EX AERO: 1.0000 "application " | INHALATION_SPRAY | CUTANEOUS | Status: DC | PRN

## 2011-07-26 MED ORDER — OXYTOCIN BOLUS FROM INFUSION
500.0000 mL | Freq: Once | INTRAVENOUS | Status: DC
  Filled 2011-07-26: qty 500

## 2011-07-26 MED ORDER — ONDANSETRON HCL 4 MG PO TABS: 4.0000 mg | ORAL_TABLET | ORAL | Status: DC | PRN

## 2011-07-26 MED ORDER — LANOLIN HYDROUS EX OINT: TOPICAL_OINTMENT | CUTANEOUS | Status: DC | PRN

## 2011-07-26 MED ORDER — CITRIC ACID-SODIUM CITRATE 334-500 MG/5ML PO SOLN: 30.0000 mL | ORAL | Status: DC | PRN

## 2011-07-26 MED ORDER — OXYCODONE-ACETAMINOPHEN 5-325 MG PO TABS: 1.0000 | ORAL_TABLET | ORAL | Status: DC | PRN

## 2011-07-26 MED ORDER — ONDANSETRON HCL 4 MG/2ML IJ SOLN: 4.0000 mg | Freq: Four times a day (QID) | INTRAMUSCULAR | Status: DC | PRN

## 2011-07-26 MED ORDER — PRENATAL PLUS 27-1 MG PO TABS
1.0000 | ORAL_TABLET | Freq: Every day | ORAL | Status: DC
  Administered 2011-07-27 – 2011-07-28 (×2): 1 via ORAL
  Filled 2011-07-26 (×2): qty 1

## 2011-07-26 MED ORDER — LACTATED RINGERS IV SOLN: 500.0000 mL | Freq: Once | INTRAVENOUS | Status: DC

## 2011-07-26 MED ORDER — LACTATED RINGERS IV SOLN: 500.0000 mL | INTRAVENOUS | Status: DC | PRN

## 2011-07-26 MED ORDER — LACTATED RINGERS IV SOLN
INTRAVENOUS | Status: DC
  Administered 2011-07-26 (×2): via INTRAVENOUS

## 2011-07-26 MED ORDER — NALOXONE HCL 0.4 MG/ML IJ SOLN
INTRAMUSCULAR | Status: AC
  Filled 2011-07-26: qty 1

## 2011-07-26 MED ORDER — ZOLPIDEM TARTRATE 5 MG PO TABS: 5.0000 mg | ORAL_TABLET | Freq: Every evening | ORAL | Status: DC | PRN

## 2011-07-26 MED ORDER — EPHEDRINE 5 MG/ML INJ
10.0000 mg | INTRAVENOUS | Status: DC | PRN
  Filled 2011-07-26: qty 4

## 2011-07-26 MED ORDER — TETANUS-DIPHTH-ACELL PERTUSSIS 5-2.5-18.5 LF-MCG/0.5 IM SUSP
0.5000 mL | Freq: Once | INTRAMUSCULAR | Status: AC
  Administered 2011-07-27: 0.5 mL via INTRAMUSCULAR
  Filled 2011-07-26: qty 0.5

## 2011-07-26 MED ORDER — SENNOSIDES-DOCUSATE SODIUM 8.6-50 MG PO TABS
2.0000 | ORAL_TABLET | Freq: Every day | ORAL | Status: DC
  Administered 2011-07-26 – 2011-07-27 (×2): 2 via ORAL

## 2011-07-26 MED ORDER — NALBUPHINE SYRINGE 5 MG/0.5 ML
5.0000 mg | INJECTION | INTRAMUSCULAR | Status: DC | PRN
  Administered 2011-07-26 (×2): 5 mg via INTRAVENOUS
  Filled 2011-07-26 (×3): qty 0.5

## 2011-07-26 MED ORDER — WITCH HAZEL-GLYCERIN EX PADS: 1.0000 "application " | MEDICATED_PAD | CUTANEOUS | Status: DC | PRN

## 2011-07-26 MED ORDER — OXYTOCIN 20 UNITS IN LACTATED RINGERS INFUSION - SIMPLE
1.0000 m[IU]/min | INTRAVENOUS | Status: DC
Start: 2011-07-26 — End: 2011-07-26
  Administered 2011-07-26: 2 m[IU]/min via INTRAVENOUS
  Filled 2011-07-26: qty 1000

## 2011-07-26 MED ORDER — ACETAMINOPHEN 325 MG PO TABS: 650.0000 mg | ORAL_TABLET | ORAL | Status: DC | PRN

## 2011-07-26 MED ORDER — IBUPROFEN 600 MG PO TABS
600.0000 mg | ORAL_TABLET | Freq: Four times a day (QID) | ORAL | Status: DC
  Administered 2011-07-26 – 2011-07-28 (×8): 600 mg via ORAL
  Filled 2011-07-26 (×8): qty 1

## 2011-07-26 MED ORDER — ONDANSETRON HCL 4 MG/2ML IJ SOLN: 4.0000 mg | INTRAMUSCULAR | Status: DC | PRN

## 2011-07-26 MED ORDER — LIDOCAINE HCL (PF) 1 % IJ SOLN
30.0000 mL | INTRAMUSCULAR | Status: DC | PRN
  Filled 2011-07-26 (×2): qty 30

## 2011-07-26 MED ORDER — FENTANYL 2.5 MCG/ML BUPIVACAINE 1/10 % EPIDURAL INFUSION (WH - ANES): 14.0000 mL/h | INTRAMUSCULAR | Status: DC

## 2011-07-26 MED ORDER — TERBUTALINE SULFATE 1 MG/ML IJ SOLN: 0.2500 mg | Freq: Once | INTRAMUSCULAR | Status: DC | PRN

## 2011-07-26 MED ORDER — PHENYLEPHRINE 40 MCG/ML (10ML) SYRINGE FOR IV PUSH (FOR BLOOD PRESSURE SUPPORT)
80.0000 ug | PREFILLED_SYRINGE | INTRAVENOUS | Status: DC | PRN
  Filled 2011-07-26: qty 5

## 2011-07-26 MED ORDER — DIPHENHYDRAMINE HCL 50 MG/ML IJ SOLN: 12.5000 mg | INTRAMUSCULAR | Status: DC | PRN

## 2011-07-26 MED ORDER — DIBUCAINE 1 % RE OINT: 1.0000 "application " | TOPICAL_OINTMENT | RECTAL | Status: DC | PRN

## 2011-07-26 MED ORDER — FLEET ENEMA 7-19 GM/118ML RE ENEM: 1.0000 | ENEMA | RECTAL | Status: DC | PRN

## 2011-07-26 MED ORDER — OXYCODONE-ACETAMINOPHEN 5-325 MG PO TABS: 2.0000 | ORAL_TABLET | ORAL | Status: DC | PRN

## 2011-07-26 MED ORDER — DIPHENHYDRAMINE HCL 25 MG PO CAPS: 25.0000 mg | ORAL_CAPSULE | Freq: Four times a day (QID) | ORAL | Status: DC | PRN

## 2011-07-26 NOTE — H&P (Signed)
Alyssa [name] is a 25 y.o. female presenting for induction of labor at 39 weeks 0 days for gestational diabetes A2. History This is a 25 year old G2 P1 001 at 39 weeks by LMP with an EDC of 08/02/2011 who presents for induction of labor due to gestational diabetes A2. She reports occasional irregular contraction.Denies fevers, chills, nausea, vomiting, LOF, decreased FA, vaginal bleeding, vaginal discharge, LOF, abdominal pain, back pain, changes in vision, HA.   OB History    Grav Para Term Preterm Abortions TAB SAB Ect Mult Living   2 1 1  0 0 0 0 0 0 1     Past Medical History  Diagnosis Date  . Gestational diabetes 05/29/2011  . Obese    No past surgical history on file. Family History: family history is not on file. Social History:  reports that she has never smoked. She has never used smokeless tobacco. She reports that she does not drink alcohol or use illicit drugs.  Review of Systems  All other systems reviewed and are negative.    Dilation: 3.5 Effacement (%): 50 Station: -3 Exam by:: Dr. Adrian [name] Blood pressure 119/67, pulse 97, temperature 97.5 F (36.4 C), temperature source Oral, resp. rate 20, height 5\' 2"  (1.575 m), weight 97.523 kg (215 lb), last menstrual period 10/26/2010, unknown if currently breastfeeding. Maternal Exam:  Uterine Assessment: Contraction strength is mild.  Contraction duration is 30 seconds. Contraction frequency is irregular.   Abdomen: Patient reports the following abdominal tenderness: incisional.  Fundal height is Term.   Estimated fetal weight is 3600g.   Fetal presentation: vertex  Introitus: Ferning test: not done.  Nitrazine test: not done. Amniotic fluid character: not assessed.  Pelvis: adequate for delivery.   Cervix: Cervix evaluated by digital exam.     Fetal Exam Fetal Monitor Review: Mode: hand-held doppler probe.   Baseline rate: 120s.  Variability: moderate (6-25 bpm).   Pattern: accelerations present and  variable decelerations.    Fetal State Assessment: Category II - tracings are indeterminate.     Physical Exam  Constitutional: She appears well-developed and well-nourished. No distress.  Cardiovascular: Normal rate and regular rhythm.   Respiratory: Effort normal.  GI: Soft. Bowel sounds are normal. She exhibits no distension and no mass. There is no tenderness. There is no rebound and no guarding.  Skin: Skin is dry.    Dilation: 3.5 Effacement (%): 50 Cervical Position: Posterior Station: -3 Presentation: Vertex Exam by:: Dr. Adrian [name]  Prenatal labs: ABO, Rh: A/POS/-- (03/30 0132) Antibody: NEG (03/30 0132) Rubella: 49.4 (03/30 0132) RPR: NON REAC (03/30 0132)  HBsAg: NEGATIVE (03/30 0132)  HIV: NON REACTIVE (03/30 0120)  GBS: Negative (09/09 0000)   Assessment/Plan: #69  25 year old G2 P1 001 with intrauterine pregnancy at 39 weeks #2 GBS negative #3 Gestational diabetes A2  We will admit the patient to labor and delivery and due to Bishop score of 6 will start Pitocin. We will check patient's blood sugars every 4 hours while in labor. We anticipate a vaginal delivery. The baby is a boy. They plan on breast and bottlefeeding.  Alyssa [name] 07/26/2011, 9:48 AM

## 2011-07-26 NOTE — Progress Notes (Signed)
07/26/2011 [facility]  Interpreter   Assisted RN Debbie with admission.

## 2011-07-27 LAB — GLUCOSE, CAPILLARY: Glucose-Capillary: 85 mg/dL (ref 70–99)

## 2011-07-27 MED ORDER — INFLUENZA VIRUS VACC SPLIT PF IM SUSP
0.5000 mL | Freq: Once | INTRAMUSCULAR | Status: AC
  Administered 2011-07-28: 0.5 mL via INTRAMUSCULAR
  Filled 2011-07-27: qty 0.5

## 2011-07-27 NOTE — Progress Notes (Signed)
Post Partum Day 1 Subjective: no complaints, voiding, tolerating PO and + flatus.  No BM yet  Objective: Blood pressure 95/58, pulse 81, temperature 97.4 F (36.3 C), temperature source Oral, resp. rate 18, height 5\' 2"  (1.575 m), weight 97.523 kg (215 lb), last menstrual period 10/26/2010, SpO2 98.00%, unknown if currently breastfeeding.  Physical Exam:  General: alert, cooperative and no distress Lochia: appropriate Uterine Fundus: firm DVT Evaluation: No evidence of DVT seen on physical exam. No cords or calf tenderness. No significant calf/ankle edema.   Basename 07/26/11 0958  HGB 11.6*  HCT 34.6*    Assessment/Plan: Plan for discharge tomorrow and Contraception IUD to be placed in the clinic at her 6 week post-partum visit  Interpreter was present for this visit.   LOS: 1 day   Alyssa [name] 07/27/2011, 7:33 AM

## 2011-07-27 NOTE — Progress Notes (Signed)
Patient seen and examined.  Agree with above note.  S: doing well, no concerns O: vitals reviewed Abd: fundus firm Lochia: appropriate Ext: no evidence of DVT A/P: PPD#1 -discharge home tomorrow  [name], Alyssa Breth 07/27/2011, 7:50 AM

## 2011-07-27 NOTE — Progress Notes (Signed)
UR chart review completed.  

## 2011-07-28 MED ORDER — IBUPROFEN 600 MG PO TABS: 600.0000 mg | ORAL_TABLET | Freq: Four times a day (QID) | ORAL | Status: AC

## 2011-07-28 MED ORDER — DOCUSATE SODIUM 100 MG PO CAPS: 100.0000 mg | ORAL_CAPSULE | Freq: Two times a day (BID) | ORAL | Status: AC | PRN

## 2011-07-28 NOTE — Progress Notes (Signed)
07/28/2011 Arty Lantzy  Interpreter  I assisted Bethann Berkshire RN with teaching.

## 2011-07-28 NOTE — Discharge Summary (Signed)
Obstetric Discharge Summary Reason for Admission: induction of labor Prenatal Procedures: ultrasound Intrapartum Procedures: spontaneous vaginal delivery Postpartum Procedures: none Complications-Operative and Postpartum: none Hemoglobin  Date Value Range Status  07/26/2011 11.6* 12.0-15.0 (g/dL) Final     HCT  Date Value Range Status  07/26/2011 34.6* 36.0-46.0 (%) Final    Discharge Diagnoses: Term Pregnancy-delivered  Discharge Information: Date: 07/28/2011 Activity: pelvic rest Diet: routine Medications: PNV, Ibuprofen and Colace Condition: stable Instructions: refer to practice specific booklet Discharge to: home Follow-up Information    Follow up with [facility]. (as scheduled for PP visit)    Contact information:   [address] [id number]          Newborn Data: Live born female  Birth Weight: 7 lb (3175 g) APGAR: 9, 9  Home with mother.  Alyssa [name], Alyssa [name] 07/28/2011, 7:24 AM

## 2011-07-28 NOTE — Progress Notes (Signed)
07/28/2011 Alyssa [name]  Interpreter  I assisted faculty Practice with discharge plan.

## 2011-07-28 NOTE — Progress Notes (Signed)
Post Partum Day 2 Subjective: no complaints, up ad lib, voiding, tolerating PO, + flatus and +BM.  Br/Bo feeding.  IUD at [facility] visit  Objective: Blood pressure 99/62, pulse 71, temperature 98.3 F (36.8 C), temperature source Oral, resp. rate 18, height 5\' 2"  (1.575 m), weight 215 lb (97.523 kg), last menstrual period 10/26/2010, SpO2 98.00%, unknown if currently breastfeeding.  Physical Exam:  General: alert, cooperative, appears stated age and no distress Lochia: appropriate Uterine Fundus: firm DVT Evaluation: No evidence of DVT seen on physical exam. Negative Homan's sign. No significant calf/ankle edema.   Basename 07/26/11 0958  HGB 11.6*  HCT 34.6*    Assessment/Plan: Discharge home and Contraception IUD at [facility] visit   LOS: 2 days   BOOTH, Anai Lipson 07/28/2011, 7:20 AM

## 2011-08-01 NOTE — Progress Notes (Signed)
NST 9/20 reviewed, reactive no decels, sent for [place] for growth after monitoring

## 2011-08-30 ENCOUNTER — Ambulatory Visit: Payer: Self-pay | Admitting: Obstetrics and Gynecology

## 2011-09-25 ENCOUNTER — Encounter: Payer: Self-pay | Admitting: Obstetrics and Gynecology

## 2011-09-25 ENCOUNTER — Ambulatory Visit (INDEPENDENT_AMBULATORY_CARE_PROVIDER_SITE_OTHER): Payer: Self-pay | Admitting: Obstetrics and Gynecology

## 2011-09-25 DIAGNOSIS — O99810 Abnormal glucose complicating pregnancy: Secondary | ICD-10-CM

## 2011-09-25 DIAGNOSIS — O24419 Gestational diabetes mellitus in pregnancy, unspecified control: Secondary | ICD-10-CM | POA: Insufficient documentation

## 2011-09-25 NOTE — Progress Notes (Signed)
  Subjective:     Alyssa [name] is a 25 y.o. female who presents for a postpartum visit. She is 4 weeks postpartum following a spontaneous vaginal delivery. I have fully reviewed the prenatal and intrapartum course. The delivery was at 39 gestational weeks IOL for A2GDM. Outcome: spontaneous vaginal delivery. Anesthesia: epidural. Postpartum course has been normal. Baby's course has been uncomplicated. Baby is feeding by both breast and bottle - Similac with Iron. Bleeding no bleeding. Bowel function is normal. Bladder function is normal. Patient is not sexually active. Contraception method is none. Postpartum depression screening: negative. No bleeding. Interested in Mirena but cannot afford insertion fee so plans to go to [facility].   The following portions of the patient's history were reviewed and updated as appropriate: allergies, current medications, past family history, past medical history, past social history, past surgical history and problem list.  Review of Systems Pertinent items are noted in HPI.   Objective:    BP 106/69  Pulse 73  Temp(Src) 98.3 F (36.8 C) (Oral)  Wt 205 lb 3.2 oz (93.078 kg)  LMP 09/13/2011  Breastfeeding? Yes  General:  alert, cooperative and no distress   Breasts:  inspection negative, no nipple discharge or bleeding, no masses or nodularity palpable  Lungs: clear to auscultation bilaterally  Heart:  S1, S2 normal  Abdomen: soft, non-tender; bowel sounds normal; no masses,  no organomegaly   Vulva:  normal  Vagina: normal vagina  Cervix:  no lesions  Corpus: normal size, contour, position, consistency, mobility, non-tender  Adnexa:  normal adnexa  Rectal Exam: Not performed.        Assessment:    Normal postpartum exam. Pap smear not done at today's visit.   Plan:    1. Contraception: abstinence 2. Pap due in May 2012. Needs FBS to f/u GDM. 3. Follow up in: 1 week for FBS or as needed.

## 2011-09-25 NOTE — Patient Instructions (Signed)
Place postpartum visit patient instructions here. Cuidados luego de un parto por va vaginal (Postpartum Care After Vaginal Delivery) Tia Alert del nacimiento del beb deber permanecer en el hospital durante 24 a 72 horas, excepto que hubiera existido algn problema, o usted sufra alguna enfermedad. Mientras se encuentre en el hospital recibir ayuda e instrucciones por parte de las enfermeras y el mdico, quienes cuidarn de usted y su beb y Chief Executive Officer darn consejos para Metallurgist, especialmente si es Financial risk analyst hijo.  En caso de ser necesario, le prescribirn analgsicos. Observar una pequea hemorragia vaginal y deber cambiar los apsitos con frecuencia. Lvese las manos cuidadosamente con agua y jabn durante al menos 20 segundos luego de cambiarse el apsito o ir al bao. Si elimina cogulos o aumenta la hemorragia, infrmelo a la enfermera. No deseche los cogulos sanguneos antes de mostrrselos a la enfermera, para asegurarse de que no es tejido Geologist, engineering. Si le han colocado una va intravenosa, se la retirarn dentro de las 24 horas, si no hay problemas. La primera vez que se levante de la cama o tome una ducha, llame a la enfermera para que la ayude que puede sentirse dbil, mareada o [place]. Si est amamantando, puede sentir contracciones dolorosas en el tero durante algunas semanas. Esto es normal y Medical sales representative, ya que de este modo el tero vuelve a su tamao normal. Si no est amamantando, utilice un sostn de soporte y trate de no tocarse las [employer] que haya dejado de producir [place]. No deben administrarse hormonas para suprimir la [place], debido a que pueden causar cogulos sanguneos. Podr seguir una dieta normal, excepto que sufra diabetes o presente otros problemas de [place].  La enfermera colocar bolsas con hielo en el sitio de la episiotoma (agrandamiento quirrgico de la apertura vaginal) para reducir Chief Technology Officer y la hinchazn. En algunos casos raros hay dificultad para  orinar, entonces la enfermera deber vaciarle la vejiga con un catter. Si le han practicado una ligadura tubaria durante el posparto ("trompas atadas", esterilizacin femenina), esto no har que permanezca ms [employer] hospital. Podr tener al beb en su habitacin todo el tiempo que lo desee si el beb no tiene ningn problema. Lleve y traiga al beb de la nursery dentro de la [place]. No lo lleve en brazos. No abandone el rea de posparto. Si la madre es Rh negativa (falta de una protena en los glbulos rojos) y el beb es Rh positivo, la madre debe aplicarse la vacuna RhoGam para evitar problemas con el factor Rh en futuros embarazos Le darn instrucciones por escrito para usted y el beb y los medicamentos necesarios cuando reciba el alta mdica. Asegrese que comprende y sigue las indicaciones. INSTRUCCIONES PARA EL CUIDADO DOMICILIARIO  Siga las instrucciones y tome los medicamentos que le indicaron cuando le dieron el alta mdica.   Utilice los medicamentos de venta libre o de prescripcin para Chief Technology Officer, Environmental health practitioner o la [place], segn se lo indique el profesional que lo asiste.   No tome aspirina, ya que puede causar hemorragias.   Aumente sus actividades un poco cada da para tener ms fuerza y Hydrographic surveyor.   No beba alcohol, especialmente si est amamantando o toma analgsicos.   Tmese la [employer] veces por da y Engineering geologist.   Podr tener una pequea hemorragia durante 2 a 4 semanas. Esto es normal.   No utilice tampones o duchas vaginales, use toallas higinicas.   Trate de que alguna persona permanezca con usted y la [employer]  primeros Administrator, Civil Service.   Descanse o duerma una siesta cuando el beb duerma.   Si est amamantando, use un buen sostn. Si no est amamantando, use un buen sostn y no estimule los pezones.   Consuma una dieta sana y siga tomando las vitaminas prenatales.   No conduzca vehculos, no realice actividades pesadas ni viaje hasta que  su mdico la autorice.   No mantenga relaciones sexuales hasta que el mdico lo permita.   Consulte con el profesional cuando puede comenzar a Education officer, environmental actividad fsica y que tipo de Glass blower/designer.   Comunquese inmediatamente con el mdico si tiene problemas luego del [place].   Comunquese con el pediatra si tiene problemas con el beb.   Programe su visita de control luego del parto y cmplala.  SOLICITE ATENCIN MDICA SI:  La temperatura se eleva por encima de 100 F (37.8 C).   Aumenta la hemorragia vaginal o elimina cogulos. Conserve algunos cogulos para mostrrselos al mdico.   Anola Gurney sangre o siente dolor al Geographical information systems officer.   Presenta secrecin vaginal con olor ftido.   Aumenta el dolor o la inflamacin en el sitio de la episiotoma (agrandamiento quirrgico de la apertura vaginal).   Sufre una cefalea grave.   Se siente deprimida.   La incisin se abre.   Se siente mareada o sufre un desmayo.   Aparece una erupcin cutnea.   Tiene una reaccin o problemas con su medicamento.   Siente dolor u observa enrojecimiento e hinchazn en el sitio de la va intravenosa.  SOLICITE ATENCIN MDICA DE INMEDIATO SI:  Siente dolor en el pecho.   Comienza a sentir falta de aire.   Se desmaya.   Siente dolor, con o sin hinchazn e irritacin en la pierna.   Tiene una hemorragia vaginal abundante, con o sin cogulos   IT consultant.   Brett Fairy secrecin vaginal con mal olor.  ASEGURESE QUE:   Comprende estas instrucciones.   Controlar su enfermedad.   Solicitar ayuda de inmediato si no mejora o empeora.  Document Released: 08/13/2007 Document Revised: 06/28/2011 [facility] Patient Information 2012 [place], [place].

## 2014-08-31 ENCOUNTER — Encounter: Payer: Self-pay | Admitting: Obstetrics and Gynecology

## 2019-05-05 LAB — AMB REFERRAL TO OB-GYN
Drug Screen, Urine: NEGATIVE
Pap: NEGATIVE
Urine Culture, OB: NEGATIVE

## 2019-05-06 ENCOUNTER — Other Ambulatory Visit (HOSPITAL_COMMUNITY): Payer: Self-pay | Admitting: Nurse Practitioner

## 2019-05-06 DIAGNOSIS — Z3682 Encounter for antenatal screening for nuchal translucency: Secondary | ICD-10-CM

## 2019-05-06 DIAGNOSIS — Z3A13 13 weeks gestation of pregnancy: Secondary | ICD-10-CM

## 2019-05-13 LAB — OB RESULTS CONSOLE RPR: RPR: NONREACTIVE

## 2019-05-13 LAB — CYSTIC FIBROSIS DIAGNOSTIC STUDY: Interpretation-CFDNA:: NEGATIVE

## 2019-05-13 LAB — AMB REFERRAL TO OB-GYN: Glucose, 1 hour: 179

## 2019-05-13 LAB — OB RESULTS CONSOLE GC/CHLAMYDIA
Chlamydia: NEGATIVE
Gonorrhea: NEGATIVE

## 2019-05-13 LAB — OB RESULTS CONSOLE ABO/RH: RH Type: POSITIVE

## 2019-05-13 LAB — OB RESULTS CONSOLE ANTIBODY SCREEN: Antibody Screen: NEGATIVE

## 2019-05-13 LAB — OB RESULTS CONSOLE HGB/HCT, BLOOD
HCT: 36 (ref 29–41)
Hemoglobin: 11.6

## 2019-05-13 LAB — OB RESULTS CONSOLE VARICELLA ZOSTER ANTIBODY, IGG: Varicella: IMMUNE

## 2019-05-16 ENCOUNTER — Encounter (HOSPITAL_COMMUNITY): Payer: Self-pay | Admitting: *Deleted

## 2019-05-20 ENCOUNTER — Ambulatory Visit (HOSPITAL_COMMUNITY): Payer: Self-pay | Admitting: *Deleted

## 2019-05-20 ENCOUNTER — Ambulatory Visit (HOSPITAL_COMMUNITY)
Admission: RE | Admit: 2019-05-20 | Discharge: 2019-05-20 | Disposition: A | Discharge status: Home / Self Care | Payer: Medicaid Other | Source: Ambulatory Visit | Attending: Obstetrics and Gynecology | Admitting: Obstetrics and Gynecology

## 2019-05-20 ENCOUNTER — Encounter (HOSPITAL_COMMUNITY): Payer: Self-pay | Admitting: *Deleted

## 2019-05-20 ENCOUNTER — Ambulatory Visit (HOSPITAL_COMMUNITY): Payer: Self-pay

## 2019-05-20 ENCOUNTER — Other Ambulatory Visit (HOSPITAL_COMMUNITY): Payer: Self-pay | Admitting: Nurse Practitioner

## 2019-05-20 ENCOUNTER — Other Ambulatory Visit: Payer: Self-pay

## 2019-05-20 VITALS — BP 124/75 | HR 98 | Temp 98.7°F

## 2019-05-20 DIAGNOSIS — Z3687 Encounter for antenatal screening for uncertain dates: Secondary | ICD-10-CM | POA: Diagnosis not present

## 2019-05-20 DIAGNOSIS — O09291 Supervision of pregnancy with other poor reproductive or obstetric history, first trimester: Secondary | ICD-10-CM

## 2019-05-20 DIAGNOSIS — O09521 Supervision of elderly multigravida, first trimester: Secondary | ICD-10-CM | POA: Diagnosis not present

## 2019-05-20 DIAGNOSIS — Z369 Encounter for antenatal screening, unspecified: Secondary | ICD-10-CM | POA: Insufficient documentation

## 2019-05-20 DIAGNOSIS — Z3682 Encounter for antenatal screening for nuchal translucency: Secondary | ICD-10-CM

## 2019-05-20 DIAGNOSIS — Z3A13 13 weeks gestation of pregnancy: Secondary | ICD-10-CM

## 2019-05-20 DIAGNOSIS — O99211 Obesity complicating pregnancy, first trimester: Secondary | ICD-10-CM | POA: Diagnosis not present

## 2019-05-26 LAB — AMB REFERRAL TO OB-GYN: Glucose 3 Hour: 99

## 2019-06-05 ENCOUNTER — Telehealth: Payer: Self-pay | Admitting: Obstetrics & Gynecology

## 2019-06-05 ENCOUNTER — Encounter: Payer: Self-pay | Admitting: *Deleted

## 2019-06-05 NOTE — Telephone Encounter (Signed)
Called with the in-office interrupter Raquel. Informed the patient of the upcoming visit as well as our office location. Informed the patient of no children or visitors due to the [place]o restrictions. The patient also answered no to [phone] questionnaire.

## 2019-06-06 ENCOUNTER — Encounter: Payer: Self-pay | Admitting: Family Medicine

## 2019-06-06 ENCOUNTER — Other Ambulatory Visit: Payer: Self-pay | Admitting: *Deleted

## 2019-06-06 ENCOUNTER — Ambulatory Visit (INDEPENDENT_AMBULATORY_CARE_PROVIDER_SITE_OTHER): Payer: Self-pay | Admitting: Family Medicine

## 2019-06-06 ENCOUNTER — Other Ambulatory Visit: Payer: Self-pay

## 2019-06-06 VITALS — BP 124/76 | HR 101 | Wt 227.9 lb

## 2019-06-06 DIAGNOSIS — O24419 Gestational diabetes mellitus in pregnancy, unspecified control: Secondary | ICD-10-CM

## 2019-06-06 DIAGNOSIS — O0990 Supervision of high risk pregnancy, unspecified, unspecified trimester: Secondary | ICD-10-CM | POA: Insufficient documentation

## 2019-06-06 DIAGNOSIS — Z789 Other specified health status: Secondary | ICD-10-CM

## 2019-06-06 DIAGNOSIS — O0992 Supervision of high risk pregnancy, unspecified, second trimester: Secondary | ICD-10-CM

## 2019-06-06 DIAGNOSIS — O09529 Supervision of elderly multigravida, unspecified trimester: Secondary | ICD-10-CM | POA: Insufficient documentation

## 2019-06-06 DIAGNOSIS — O09522 Supervision of elderly multigravida, second trimester: Secondary | ICD-10-CM

## 2019-06-06 DIAGNOSIS — Z3A16 16 weeks gestation of pregnancy: Secondary | ICD-10-CM

## 2019-06-06 MED ORDER — ASPIRIN 81 MG PO CHEW: 81.0000 mg | CHEWABLE_TABLET | Freq: Every day | ORAL | 3 refills | Status: DC

## 2019-06-06 MED ORDER — PRENATAL VITAMIN 27-0.8 MG PO TABS: 1.0000 | ORAL_TABLET | Freq: Every day | ORAL | 3 refills | Status: AC

## 2019-06-06 NOTE — Progress Notes (Signed)
Pt was given a Burna issued blood pressure cuff and instructed on use. Pt was told take blood pressure once a week and inform us if blood pressure is 140/90 or higher.

## 2019-06-06 NOTE — Telephone Encounter (Signed)
Patient seen in office today and needs diabetes education for GDM and we cannot get her into the office within a timely manner so we have sent referraal to [facility]. I called to schedule appointment but they are closed for lunch - will need to call back.

## 2019-06-06 NOTE — Progress Notes (Signed)
    PRENATAL VISIT NOTE  Subjective:  Alyssa [name] is a 33 y.o. G3P2002 at [redacted]w[redacted]d being seen today for transferring prenatal care from [facility] due to new diagnosis of GDM.  She is currently monitored for the following issues for this high-risk pregnancy and has GDM, class A2; Supervision of high risk pregnancy, antepartum; Language barrier; and AMA (advanced maternal age) multigravida 35+ on their problem list.  Patient reports no complaints.  Contractions: Not present. Vag. Bleeding: None.  Movement: Present. Denies leaking of fluid.   The following portions of the patient's history were reviewed and updated as appropriate: allergies, current medications, past family history, past medical history, past social history, past surgical history and problem list.   Objective:   Vitals:   06/06/19 0844  BP: 124/76  Pulse: (!) 101  Weight: 227 lb 14.4 oz (103.4 kg)    Fetal Status: Fetal Heart Rate (bpm): 154   Movement: Present     General:  Alert, oriented and cooperative. Patient is in no acute distress.  Skin: Skin is warm and dry. No rash noted.   Cardiovascular: Normal heart rate noted  Respiratory: Normal respiratory effort, no problems with respiration noted  Abdomen: Soft, gravid, appropriate for gestational age.  Pain/Pressure: Absent     Pelvic: Cervical exam deferred        Extremities: Normal range of motion.  Edema: None  Mental Status: Normal mood and affect. Normal behavior. Normal judgment and thought content.   Assessment and Plan:  Pregnancy: G3P2002 at [redacted]w[redacted]d 1. Supervision of high risk pregnancy in second trimester  - [facility] AMB BABYSCRIPTS SCHEDULE OPTIMIZATION - [place] MFM OB DETAIL +14 WK; Future - Prenatal Vit-Fe Fumarate-FA (PRENATAL VITAMIN) 27-0.8 MG TABS; Take 1 tablet by mouth daily.  Dispense: 90 tablet; Refill: 3   2. GDM, class A2/B Diagnosed at an early GA Baseline labs and needs Diabetes and nutrition management ASAP--needs meter Will need fetal  ECHO and optho based on labs Needs baby ASA--ordered Will need assessment about whether diet is sufficient soon. - Comprehensive metabolic panel - TSH - Hemoglobin A1c - Protein / creatinine ratio, urine - [place] MFM OB DETAIL +14 WK; Future  4. Language barrier Raquel used  5. Multigravida of advanced maternal age in second trimester Declines genetics  General obstetric precautions including but not limited to vaginal bleeding, contractions, leaking of fluid and fetal movement were reviewed in detail with the patient. Please refer to After Visit Summary for other counseling recommendations.   Return in about 2 weeks (around 06/20/2019) for [facility], in person diabetes ed asap.  Future Appointments  Date Time Provider [place]  06/19/2019 10:55 AM Emily Filbert, MD [facility] WOC  06/19/2019 11:15 AM WOC-EDUCATION [facility]  06/26/2019  8:15 AM WH-MFC NURSE Seth Ward MFC-US  06/26/2019  8:15 AM WH-MFC [place] 4 WH-MFCUS MFC-US    Donnamae Jude, MD

## 2019-06-06 NOTE — Telephone Encounter (Signed)
-----   Message from Donnamae Jude, MD sent at 06/06/2019 11:53 AM EDT ----- I forgot to start her on Baby ASA--please call with interpreter to explain that it is needed due to her diabetes and to take it daily.

## 2019-06-07 LAB — COMPREHENSIVE METABOLIC PANEL
ALT: 17 IU/L (ref 0–32)
AST: 15 IU/L (ref 0–40)
Albumin/Globulin Ratio: 1.6 (ref 1.2–2.2)
Albumin: 4.1 g/dL (ref 3.8–4.8)
Alkaline Phosphatase: 54 IU/L (ref 39–117)
BUN/Creatinine Ratio: 18 (ref 9–23)
BUN: 7 mg/dL (ref 6–20)
Bilirubin Total: <0.2 mg/dL (ref 0.0–1.2)
CO2: 22 mmol/L (ref 20–29)
Calcium: 9.8 mg/dL (ref 8.7–10.2)
Chloride: 102 mmol/L (ref 96–106)
Creatinine, Ser: 0.4 mg/dL — ABNORMAL LOW (ref 0.57–1.00)
GFR calc Af Amer: 157 mL/min/{1.73_m2} (ref >59)
GFR calc non Af Amer: 136 mL/min/{1.73_m2} (ref >59)
Globulin, Total: 2.5 g/dL (ref 1.5–4.5)
Glucose: 91 mg/dL (ref 65–99)
Potassium: 4.1 mmol/L (ref 3.5–5.2)
Sodium: 138 mmol/L (ref 134–144)
Total Protein: 6.6 g/dL (ref 6.0–8.5)

## 2019-06-07 LAB — TSH: TSH: 2.64 u[IU]/mL (ref 0.450–4.500)

## 2019-06-07 LAB — HEMOGLOBIN A1C
Est. average glucose Bld gHb Est-mCnc: 117 mg/dL
Hgb A1c MFr Bld: 5.7 % — ABNORMAL HIGH (ref 4.8–5.6)

## 2019-06-07 LAB — PROTEIN / CREATININE RATIO, URINE
Creatinine, Urine: 125.7 mg/dL
Protein, Ur: 11 mg/dL
Protein/Creat Ratio: 88 mg/g creat (ref 0–200)

## 2019-06-09 NOTE — Telephone Encounter (Addendum)
I called Tiwanna with Velna Hatchet Interpreter [phone] to see if she could come to 1015 appt tomorrow that came open. We left a message we were calling to go over results and give you an appointment- please call our office. 'Jamarl Pew,RN

## 2019-06-10 NOTE — Telephone Encounter (Signed)
I called [facility] to see if they had an appointment before 8/20 ( we have her scheduled in our office) and they have an opening tomorrow at 11:00. I informed them to hold it and I will see if I can reach patient and see if she can come tomorrow.

## 2019-06-10 NOTE — Telephone Encounter (Addendum)
I called [facility] and they do not have openings until 06/17/19.  I informed them we will just keep her with seeing [place] for her diabetes education on 06/19/19. I called Teniqua with Eda, Interpreter and informed her we cannot get her appointment on Thursday/friday so we will  Have her come to the already scheduled appointments on 06/19/19. She voices understanding. I will also see if we have any availability to get her in for meter training before then.  Linda,RN

## 2019-06-10 NOTE — Telephone Encounter (Signed)
I called Alyssa [name] with Interpreter [place] and she states she needs to see if she can get childcare - she asks if we can call her back in 1/2 hour or so to verify.

## 2019-06-10 NOTE — Telephone Encounter (Signed)
We called Alyssa [name] back at 12:45 and she states she can't get childcare tomorrow but can Thursday or Friday. I explained we will need to talk with [facility] and Diabetes and will call her back after 2 when they open after lunch.

## 2019-06-11 ENCOUNTER — Ambulatory Visit: Payer: Self-pay | Admitting: *Deleted

## 2019-06-19 ENCOUNTER — Other Ambulatory Visit: Payer: Self-pay

## 2019-06-19 ENCOUNTER — Ambulatory Visit (INDEPENDENT_AMBULATORY_CARE_PROVIDER_SITE_OTHER): Payer: Self-pay | Admitting: Obstetrics & Gynecology

## 2019-06-19 ENCOUNTER — Encounter: Payer: Self-pay | Attending: Obstetrics & Gynecology | Admitting: *Deleted

## 2019-06-19 ENCOUNTER — Ambulatory Visit: Payer: Self-pay | Admitting: *Deleted

## 2019-06-19 VITALS — BP 107/56 | HR 91 | Temp 98.4°F | Wt 225.7 lb

## 2019-06-19 DIAGNOSIS — O99212 Obesity complicating pregnancy, second trimester: Secondary | ICD-10-CM

## 2019-06-19 DIAGNOSIS — O0992 Supervision of high risk pregnancy, unspecified, second trimester: Secondary | ICD-10-CM

## 2019-06-19 DIAGNOSIS — O09522 Supervision of elderly multigravida, second trimester: Secondary | ICD-10-CM

## 2019-06-19 DIAGNOSIS — Z789 Other specified health status: Secondary | ICD-10-CM

## 2019-06-19 DIAGNOSIS — O99210 Obesity complicating pregnancy, unspecified trimester: Secondary | ICD-10-CM

## 2019-06-19 DIAGNOSIS — Z3A18 18 weeks gestation of pregnancy: Secondary | ICD-10-CM

## 2019-06-19 DIAGNOSIS — O24410 Gestational diabetes mellitus in pregnancy, diet controlled: Secondary | ICD-10-CM | POA: Insufficient documentation

## 2019-06-19 DIAGNOSIS — Z3A00 Weeks of gestation of pregnancy not specified: Secondary | ICD-10-CM | POA: Insufficient documentation

## 2019-06-19 DIAGNOSIS — O24419 Gestational diabetes mellitus in pregnancy, unspecified control: Secondary | ICD-10-CM

## 2019-06-19 DIAGNOSIS — O0990 Supervision of high risk pregnancy, unspecified, unspecified trimester: Secondary | ICD-10-CM

## 2019-06-19 NOTE — Progress Notes (Signed)
This 33 yo G3P2 at 62 1/[redacted] weeks ega was here today to show me her sugars and determine if she needs meds. However, she meets with Bev later today to get a meter and supplies and instruction on testing her sugars. I rec'd baby asa (which she has not yet started) I stressed that she should bring her recorded sugars and her meter to every appt. She will come back in 2 weeks.

## 2019-06-19 NOTE — Progress Notes (Signed)
Patient was seen on 06/20/2019 for Pre- Diabetes and pregnancy self-management. Patient speaks Spanish, we used live interpretor for this visit. EDD 11/22/2019. She states she had GDM with her last pregnancy about 8 years ago. Patient states not aware of her history of pre- diabetes.  Diet history obtained. Patient eats good variety of all food groups and beverages include water and milk.  She states some previous training on Carbohydrate Counting. Patient is currently on no diabetes medications.  The following learning objectives were met by the patient :   States the definition of Pre- Diabetes and pregnancy based on A1c of 5.7%  States why dietary management is important in controlling blood glucose  Describes the effects of carbohydrates on blood glucose levels  Demonstrates ability to create a balanced meal plan  Demonstrates carbohydrate counting   States when to check blood glucose levels  Demonstrates proper blood glucose monitoring techniques  States the effect of stress and exercise on blood glucose levels  States the importance of limiting caffeine and abstaining from alcohol and smoking  Plan:   Aim for 3 Carb Choices per meal (45 grams) +/- 1 either way   Aim for 1-2 Carbs per snack  Begin reading food labels for Total Carbohydrate of foods  Consider  increasing your activity level by walking or other activity daily as tolerated  Begin checking BG before breakfast and 2 hours after first bite of breakfast, lunch and dinner as directed by MD   Bring Log Book/Sheet and meter to every medical appointment OR use Baby Scripts (see below)  Patient not appropriate for Baby Scripts due to language barrier   Take medication as directed by MD  Blood glucose monitor given: Prodigy Lot # [id number]  Blood glucose reading: 112 mg/dl after meal  Patient instructed to monitor glucose levels: FBS: 60 - 95 mg/dl 2 hour: <120 mg/dl  Patient received the following handouts:  Spanish  Nutrition Diabetes and Pregnancy  Carbohydrate Counting List  BG Log Sheet  Patient will be seen for follow-up as needed.

## 2019-06-26 ENCOUNTER — Ambulatory Visit (HOSPITAL_COMMUNITY): Payer: Self-pay | Admitting: *Deleted

## 2019-06-26 ENCOUNTER — Other Ambulatory Visit (HOSPITAL_COMMUNITY): Payer: Self-pay | Admitting: *Deleted

## 2019-06-26 ENCOUNTER — Ambulatory Visit (HOSPITAL_COMMUNITY)
Admission: RE | Admit: 2019-06-26 | Discharge: 2019-06-26 | Disposition: A | Discharge status: Home / Self Care | Payer: Self-pay | Source: Ambulatory Visit | Attending: Family Medicine | Admitting: Family Medicine

## 2019-06-26 ENCOUNTER — Encounter (HOSPITAL_COMMUNITY): Payer: Self-pay

## 2019-06-26 ENCOUNTER — Other Ambulatory Visit: Payer: Self-pay

## 2019-06-26 DIAGNOSIS — O99211 Obesity complicating pregnancy, first trimester: Secondary | ICD-10-CM

## 2019-06-26 DIAGNOSIS — O0992 Supervision of high risk pregnancy, unspecified, second trimester: Secondary | ICD-10-CM | POA: Insufficient documentation

## 2019-06-26 DIAGNOSIS — O0990 Supervision of high risk pregnancy, unspecified, unspecified trimester: Secondary | ICD-10-CM | POA: Insufficient documentation

## 2019-06-26 DIAGNOSIS — O24410 Gestational diabetes mellitus in pregnancy, diet controlled: Secondary | ICD-10-CM

## 2019-06-26 DIAGNOSIS — O24419 Gestational diabetes mellitus in pregnancy, unspecified control: Secondary | ICD-10-CM | POA: Insufficient documentation

## 2019-06-26 DIAGNOSIS — O09521 Supervision of elderly multigravida, first trimester: Secondary | ICD-10-CM

## 2019-06-26 DIAGNOSIS — Z3A18 18 weeks gestation of pregnancy: Secondary | ICD-10-CM

## 2019-07-16 ENCOUNTER — Ambulatory Visit (INDEPENDENT_AMBULATORY_CARE_PROVIDER_SITE_OTHER): Payer: Self-pay | Admitting: Obstetrics and Gynecology

## 2019-07-16 ENCOUNTER — Other Ambulatory Visit: Payer: Self-pay

## 2019-07-16 VITALS — BP 115/80 | HR 67 | Wt 222.2 lb

## 2019-07-16 DIAGNOSIS — O0992 Supervision of high risk pregnancy, unspecified, second trimester: Secondary | ICD-10-CM

## 2019-07-16 DIAGNOSIS — Z3A21 21 weeks gestation of pregnancy: Secondary | ICD-10-CM

## 2019-07-16 DIAGNOSIS — O24419 Gestational diabetes mellitus in pregnancy, unspecified control: Secondary | ICD-10-CM

## 2019-07-16 DIAGNOSIS — Z789 Other specified health status: Secondary | ICD-10-CM

## 2019-07-16 DIAGNOSIS — O0990 Supervision of high risk pregnancy, unspecified, unspecified trimester: Secondary | ICD-10-CM

## 2019-07-16 DIAGNOSIS — O09522 Supervision of elderly multigravida, second trimester: Secondary | ICD-10-CM

## 2019-07-16 MED ORDER — METFORMIN HCL 500 MG PO TABS: 500.0000 mg | ORAL_TABLET | Freq: Every day | ORAL | 3 refills | Status: DC

## 2019-07-16 NOTE — Progress Notes (Signed)
Prenatal Visit Note Date: 07/16/2019 Clinic: [facility]  Subjective:  Alyssa [name] is a 33 y.o. G3P2002 at [redacted]w[redacted]d being seen today for ongoing prenatal care.  She is currently monitored for the following issues for this high-risk pregnancy and has GDM, class A2; Supervision of high risk pregnancy, antepartum; Language barrier; and AMA (advanced maternal age) multigravida 35+ on their problem list.  Patient reports no complaints.   Contractions: Not present. Vag. Bleeding: None.  Movement: Present. Denies leaking of fluid.   The following portions of the patient's history were reviewed and updated as appropriate: allergies, current medications, past family history, past medical history, past social history, past surgical history and problem list. Problem list updated.  Objective:   Vitals:   07/16/19 1449  BP: 115/80  Pulse: 67  Weight: 222 lb 3.2 oz (100.8 kg)    Fetal Status:     Movement: Present     General:  Alert, oriented and cooperative. Patient is in no acute distress.  Skin: Skin is warm and dry. No rash noted.   Cardiovascular: Normal heart rate noted  Respiratory: Normal respiratory effort, no problems with respiration noted  Abdomen: Soft, gravid, appropriate for gestational age. Pain/Pressure: Absent     Pelvic:  Cervical exam deferred        Extremities: Normal range of motion.  Edema: None  Mental Status: Normal mood and affect. Normal behavior. Normal judgment and thought content.   Urinalysis:      Assessment and Plan:  Pregnancy: G3P2002 at 108w4d  1. Supervision of high risk pregnancy, antepartum Routine care. Ask about BC later in pregnancy  2. GDM Am fastings are high 90s-100s. She eats dinner around 1900 and then snacks at 2200. Recommend metformin 500 with that snack.  2h pp are fine.  Getting serial growth u/s already  3. Multigravida of advanced maternal age in second trimester No issues  4. Language  barrier Interpreter used  Preterm labor symptoms and general obstetric precautions including but not limited to vaginal bleeding, contractions, leaking of fluid and fetal movement were reviewed in detail with the patient. Please refer to After Visit Summary for other counseling recommendations.  Return in about 8 days (around 07/24/2019) for high risk, in person.   Aletha Halim, MD

## 2019-07-23 ENCOUNTER — Telehealth: Payer: Self-pay | Admitting: Family Medicine

## 2019-07-23 NOTE — Telephone Encounter (Signed)
Spanish interpreter Eda called patient about her appointment on 9/24 @ 9:40. Patient instructed to wear a face mask for the entire appointment and no visitors are allowed. Patient screened for covid symptoms and denied having any.

## 2019-07-24 ENCOUNTER — Ambulatory Visit (HOSPITAL_COMMUNITY)
Admission: RE | Admit: 2019-07-24 | Discharge: 2019-07-24 | Disposition: A | Discharge status: Home / Self Care | Payer: Self-pay | Source: Ambulatory Visit | Attending: Family Medicine | Admitting: Family Medicine

## 2019-07-24 ENCOUNTER — Ambulatory Visit (INDEPENDENT_AMBULATORY_CARE_PROVIDER_SITE_OTHER): Payer: Self-pay | Admitting: General Practice

## 2019-07-24 ENCOUNTER — Other Ambulatory Visit: Payer: Self-pay

## 2019-07-24 VITALS — BP 118/62 | HR 82 | Wt 220.0 lb

## 2019-07-24 DIAGNOSIS — Z3A22 22 weeks gestation of pregnancy: Secondary | ICD-10-CM

## 2019-07-24 DIAGNOSIS — Z362 Encounter for other antenatal screening follow-up: Secondary | ICD-10-CM

## 2019-07-24 DIAGNOSIS — O24419 Gestational diabetes mellitus in pregnancy, unspecified control: Secondary | ICD-10-CM

## 2019-07-24 DIAGNOSIS — O99212 Obesity complicating pregnancy, second trimester: Secondary | ICD-10-CM

## 2019-07-24 DIAGNOSIS — O24410 Gestational diabetes mellitus in pregnancy, diet controlled: Secondary | ICD-10-CM | POA: Insufficient documentation

## 2019-07-24 DIAGNOSIS — O09522 Supervision of elderly multigravida, second trimester: Secondary | ICD-10-CM

## 2019-07-24 NOTE — Progress Notes (Signed)
Patient presents to office today for blood sugar review following last OB visit on 9/16 in which she was placed on Metformin 500mg  qhs. Patient reports good fetal movement since last visit and she has been taking her Metformin daily. Since OB visit her fasting blood sugars have been in the low to mid 90s with a few high 90s low 100s. Postprandial blood sugars around 110s-120s. Reviewed with Dr Hulan Fray who states no further intervention is needed at this time, patient should follow up as previously planned. Discussed with patient. She verbalized understanding & had no questions. Patient will follow up at [facility] visit on 10/7.  Koren Bound RN BSN 07/24/19

## 2019-07-25 ENCOUNTER — Other Ambulatory Visit (HOSPITAL_COMMUNITY): Payer: Self-pay | Admitting: *Deleted

## 2019-07-25 DIAGNOSIS — O09529 Supervision of elderly multigravida, unspecified trimester: Secondary | ICD-10-CM

## 2019-07-28 NOTE — Progress Notes (Signed)
I have reviewed this chart and agree with the RN/CMA assessment and management.    Graysin Luczynski C Giani Betzold, MD, FACOG Attending Physician, Faculty Practice [facility]  

## 2019-08-06 ENCOUNTER — Other Ambulatory Visit: Payer: Self-pay

## 2019-08-06 ENCOUNTER — Ambulatory Visit (INDEPENDENT_AMBULATORY_CARE_PROVIDER_SITE_OTHER): Payer: Self-pay | Admitting: Obstetrics and Gynecology

## 2019-08-06 VITALS — BP 122/66 | HR 89 | Wt 219.7 lb

## 2019-08-06 DIAGNOSIS — O3662X0 Maternal care for excessive fetal growth, second trimester, not applicable or unspecified: Secondary | ICD-10-CM | POA: Insufficient documentation

## 2019-08-06 DIAGNOSIS — Z3A24 24 weeks gestation of pregnancy: Secondary | ICD-10-CM

## 2019-08-06 DIAGNOSIS — O0990 Supervision of high risk pregnancy, unspecified, unspecified trimester: Secondary | ICD-10-CM

## 2019-08-06 DIAGNOSIS — O0992 Supervision of high risk pregnancy, unspecified, second trimester: Secondary | ICD-10-CM

## 2019-08-06 DIAGNOSIS — Z789 Other specified health status: Secondary | ICD-10-CM

## 2019-08-06 DIAGNOSIS — O24419 Gestational diabetes mellitus in pregnancy, unspecified control: Secondary | ICD-10-CM

## 2019-08-06 DIAGNOSIS — O09522 Supervision of elderly multigravida, second trimester: Secondary | ICD-10-CM

## 2019-08-06 MED ORDER — MICONAZOLE NITRATE 2 % VA CREA: 1.0000 | TOPICAL_CREAM | Freq: Every day | VAGINAL | 2 refills | Status: DC

## 2019-08-06 NOTE — Progress Notes (Signed)
Spanish Interpreter Eda R. 

## 2019-08-07 NOTE — Progress Notes (Signed)
Prenatal Visit Note Date: 08/06/2019 Clinic: [facility]  Subjective:  Alyssa [name] is a 33 y.o. G3P2002 at [redacted]w[redacted]d being seen today for ongoing prenatal care.  She is currently monitored for the following issues for this high-risk pregnancy and has GDM, class A2; Supervision of high risk pregnancy, antepartum; Language barrier; AMA (advanced maternal age) multigravida 35+; and Excessive fetal growth affecting management of mother in second trimester, antepartum on their problem list.  Patient reports vaginal irration, ? discharge   Contractions: Not present. Vag. Bleeding: None.  Movement: Present. Denies leaking of fluid.   The following portions of the patient's history were reviewed and updated as appropriate: allergies, current medications, past family history, past medical history, past social history, past surgical history and problem list. Problem list updated.  Objective:   Vitals:   08/06/19 1505  BP: 122/66  Pulse: 89  Weight: 219 lb 11.2 oz (99.7 kg)    Fetal Status: Fetal Heart Rate (bpm): 155   Movement: Present     General:  Alert, oriented and cooperative. Patient is in no acute distress.  Skin: Skin is warm and dry. No rash noted.   Cardiovascular: Normal heart rate noted  Respiratory: Normal respiratory effort, no problems with respiration noted  Abdomen: Soft, gravid, appropriate for gestational age. Pain/Pressure: Absent     Pelvic:  Cervical exam deferred        Extremities: Normal range of motion.  Edema: None  Mental Status: Normal mood and affect. Normal behavior. Normal judgment and thought content.   Urinalysis:      Assessment and Plan:  Pregnancy: G3P2002 at [redacted]w[redacted]d  1. GDM, class A2 Doing well on metformin 500mg  qhs Has rpt growth on 10/22  2. Supervision of high risk pregnancy, antepartum Routine care. Continue low dose asa Monistat 7. If no help, then exam nv  3. Multigravida of advanced maternal age in second  trimester No issues  4. Language barrier Interpreter used  5. Excessive fetal growth affecting management of pregnancy in second trimester, single or unspecified fetus  Preterm labor symptoms and general obstetric precautions including but not limited to vaginal bleeding, contractions, leaking of fluid and fetal movement were reviewed in detail with the patient. Please refer to After Visit Summary for other counseling recommendations.  Return in about 15 days (around 08/21/2019) for high risk, in person.   Aletha Halim, MD

## 2019-08-20 ENCOUNTER — Other Ambulatory Visit: Payer: Self-pay

## 2019-08-20 ENCOUNTER — Ambulatory Visit (INDEPENDENT_AMBULATORY_CARE_PROVIDER_SITE_OTHER): Payer: Self-pay | Admitting: Family Medicine

## 2019-08-20 VITALS — BP 113/69 | HR 92 | Wt 218.4 lb

## 2019-08-20 DIAGNOSIS — Z23 Encounter for immunization: Secondary | ICD-10-CM

## 2019-08-20 DIAGNOSIS — O3662X0 Maternal care for excessive fetal growth, second trimester, not applicable or unspecified: Secondary | ICD-10-CM

## 2019-08-20 DIAGNOSIS — Z3A26 26 weeks gestation of pregnancy: Secondary | ICD-10-CM

## 2019-08-20 DIAGNOSIS — Z789 Other specified health status: Secondary | ICD-10-CM

## 2019-08-20 DIAGNOSIS — O09522 Supervision of elderly multigravida, second trimester: Secondary | ICD-10-CM

## 2019-08-20 DIAGNOSIS — O0990 Supervision of high risk pregnancy, unspecified, unspecified trimester: Secondary | ICD-10-CM

## 2019-08-20 DIAGNOSIS — O24419 Gestational diabetes mellitus in pregnancy, unspecified control: Secondary | ICD-10-CM

## 2019-08-20 DIAGNOSIS — O0992 Supervision of high risk pregnancy, unspecified, second trimester: Secondary | ICD-10-CM

## 2019-08-20 NOTE — Progress Notes (Signed)
   PRENATAL VISIT NOTE  Subjective:  Estephania Robles-Vargas is a 33 y.o. G3P2002 at [redacted]w[redacted]d being seen today for ongoing prenatal care.  She is currently monitored for the following issues for this high-risk pregnancy and has GDM, class A2; Supervision of high risk pregnancy, antepartum; Language barrier; AMA (advanced maternal age) multigravida 35+; and Excessive fetal growth affecting management of mother in second trimester, antepartum on their problem list.  Patient reports no complaints.  Contractions: Not present. Vag. Bleeding: None.  Movement: Present. Denies leaking of fluid.   The following portions of the patient's history were reviewed and updated as appropriate: allergies, current medications, past family history, past medical history, past social history, past surgical history and problem list.   Objective:   Vitals:   08/20/19 1459  BP: 113/69  Pulse: 92  Weight: 218 lb 6.4 oz (99.1 kg)    Fetal Status: Fetal Heart Rate (bpm): 150   Movement: Present     General:  Alert, oriented and cooperative. Patient is in no acute distress.  Skin: Skin is warm and dry. No rash noted.   Cardiovascular: Normal heart rate noted  Respiratory: Normal respiratory effort, no problems with respiration noted  Abdomen: Soft, gravid, appropriate for gestational age.  Pain/Pressure: Absent     Pelvic: Cervical exam deferred        Extremities: Normal range of motion.  Edema: None  Mental Status: Normal mood and affect. Normal behavior. Normal judgment and thought content.   Assessment and Plan:  Pregnancy: G3P2002 at [redacted]w[redacted]d 1. Supervision of high risk pregnancy, antepartum TDaP and flu today - CBC - HIV Antibody (routine testing w rflx) - RPR  2. Language barrier Spanish interpreter: Eda used   3. Multigravida of advanced maternal age in second trimester Declined genetics  4. Excessive fetal growth affecting management of pregnancy in second trimester, single or unspecified fetus  EFW at 97%  5. GDM, class A2 fb 82-97 2 hr pp 101-130 Reviewed meter--most are in range  Preterm labor symptoms and general obstetric precautions including but not limited to vaginal bleeding, contractions, leaking of fluid and fetal movement were reviewed in detail with the patient. Please refer to After Visit Summary for other counseling recommendations.   Return in 2 weeks (on 09/03/2019) for needs U/S scheduled, in person, [facility], needs MD.  Future Appointments  Date Time Provider [place]  09/03/2019  1:45 PM WH-MFC [place] 2 WH-MFCUS MFC-US  09/03/2019  3:15 PM Donnamae Jude, MD [facility]OC    Donnamae Jude, MD

## 2019-08-20 NOTE — Patient Instructions (Signed)

## 2019-08-21 ENCOUNTER — Ambulatory Visit (HOSPITAL_COMMUNITY): Payer: Self-pay

## 2019-08-21 ENCOUNTER — Encounter (HOSPITAL_COMMUNITY): Payer: Self-pay

## 2019-09-03 ENCOUNTER — Other Ambulatory Visit: Payer: Self-pay

## 2019-09-03 ENCOUNTER — Other Ambulatory Visit (HOSPITAL_COMMUNITY): Payer: Self-pay | Admitting: *Deleted

## 2019-09-03 ENCOUNTER — Ambulatory Visit (INDEPENDENT_AMBULATORY_CARE_PROVIDER_SITE_OTHER): Payer: Self-pay | Admitting: Obstetrics & Gynecology

## 2019-09-03 ENCOUNTER — Ambulatory Visit (HOSPITAL_COMMUNITY)
Admission: RE | Admit: 2019-09-03 | Discharge: 2019-09-03 | Disposition: A | Discharge status: Home / Self Care | Payer: Self-pay | Source: Ambulatory Visit | Attending: Maternal & Fetal Medicine | Admitting: Maternal & Fetal Medicine

## 2019-09-03 VITALS — BP 125/68 | HR 83 | Wt 219.0 lb

## 2019-09-03 DIAGNOSIS — O09529 Supervision of elderly multigravida, unspecified trimester: Secondary | ICD-10-CM | POA: Insufficient documentation

## 2019-09-03 DIAGNOSIS — Z362 Encounter for other antenatal screening follow-up: Secondary | ICD-10-CM

## 2019-09-03 DIAGNOSIS — O99213 Obesity complicating pregnancy, third trimester: Secondary | ICD-10-CM

## 2019-09-03 DIAGNOSIS — Z3A28 28 weeks gestation of pregnancy: Secondary | ICD-10-CM

## 2019-09-03 DIAGNOSIS — O3663X0 Maternal care for excessive fetal growth, third trimester, not applicable or unspecified: Secondary | ICD-10-CM

## 2019-09-03 DIAGNOSIS — O0993 Supervision of high risk pregnancy, unspecified, third trimester: Secondary | ICD-10-CM

## 2019-09-03 DIAGNOSIS — O24419 Gestational diabetes mellitus in pregnancy, unspecified control: Secondary | ICD-10-CM

## 2019-09-03 DIAGNOSIS — O09523 Supervision of elderly multigravida, third trimester: Secondary | ICD-10-CM

## 2019-09-03 DIAGNOSIS — O0990 Supervision of high risk pregnancy, unspecified, unspecified trimester: Secondary | ICD-10-CM

## 2019-09-03 DIAGNOSIS — O24410 Gestational diabetes mellitus in pregnancy, diet controlled: Secondary | ICD-10-CM

## 2019-09-03 NOTE — Progress Notes (Signed)
PRENATAL VISIT NOTE  Subjective:  Alyssa [name] is a 33 y.o. G3P2002 at 5334w4d being seen today for ongoing prenatal care.  She is currently monitored for the following issues for this high-risk pregnancy and has GDM, class A2; Supervision of high risk pregnancy, antepartum; Language barrier; AMA (advanced maternal age) multigravida 35+; and Excessive fetal growth affecting management of mother in second trimester, antepartum on their problem list.  Patient reports no complaints.  Contractions: Not present. Vag. Bleeding: None.  Movement: Present. Denies leaking of fluid.   The following portions of the patient's history were reviewed and updated as appropriate: allergies, current medications, past family history, past medical history, past social history, past surgical history and problem list.   Objective:   Vitals:   09/03/19 1521  BP: 125/68  Pulse: 83  Weight: 219 lb (99.3 kg)    Fetal Status: Fetal Heart Rate (bpm): 140   Movement: Present     General:  Alert, oriented and cooperative. Patient is in no acute distress.  Skin: Skin is warm and dry. No rash noted.   Cardiovascular: Normal heart rate noted  Respiratory: Normal respiratory effort, no problems with respiration noted  Abdomen: Soft, gravid, appropriate for gestational age.  Pain/Pressure: Absent     Pelvic: Cervical exam deferred        Extremities: Normal range of motion.  Edema: None  Mental Status: Normal mood and affect. Normal behavior. Normal judgment and thought content.    Imaging: [place] Mfm Ob Follow Up  Result Date: 09/03/2019 ----------------------------------------------------------------------  OBSTETRICS REPORT                       (Signed Final 09/03/2019 03:35 pm) ---------------------------------------------------------------------- Patient Info  ID #:       [id number]                          D.O.B.:  Jun 17, 1986 (32 yrs)  Name:       Alyssa SamsHORTENCIA ROBLES-               Visit Date: 09/03/2019  02:18 pm              [name] ---------------------------------------------------------------------- Performed By  Performed By:     Emeline DarlingKasie E Kiser BS,      Ref. Address:     [address]                    RD[facility]                                                             [address]                                                             [place], [place]  [address]  Attending:        Ma Rings MD         [facility]  Referred By:      [facility]-                    Faculty Physician ---------------------------------------------------------------------- Orders   #  Description                          Code         Ordered By   1  [place] MFM OB FOLLOW UP                  60454.09     Lin Landsman  ----------------------------------------------------------------------   #  Order #                    Accession #                 Episode #   1  [id number]                  [id number]                  [id number]  ---------------------------------------------------------------------- Indications   Advanced maternal age multigravida 103+,        O8.523   third trimester   Obesity complicating pregnancy, third          O99.213   trimester   Maternal care for excessive fetal growth,      O36.63X0   third trimester, fetus unspecified   Gestational diabetes in pregnancy, diet        O24.410   controlled   Encounter for other antenatal screening        Z36.2   follow-up   [redacted] weeks gestation of pregnancy                Z3A.28  ---------------------------------------------------------------------- Vital Signs                                                 Height:        5'2" ---------------------------------------------------------------------- Fetal Evaluation  Num Of Fetuses:         1   Fetal Heart Rate(bpm):  159  Cardiac Activity:       Observed  Presentation:           Cephalic  Placenta:  Posterior  P. Cord Insertion:      Previously Visualized  Amniotic Fluid  AFI FV:      Within normal limits  AFI Sum(cm)     %Tile       Largest Pocket(cm)  16.65           61          5.19  RUQ(cm)       RLQ(cm)       LUQ(cm)        LLQ(cm)  5.19          3.26          3.02           5.18 ---------------------------------------------------------------------- Biometry  BPD:      76.4  mm     G. Age:  30w 5d         92  %    CI:        72.76   %    70 - 86                                                          FL/HC:      19.5   %    19.6 - 20.8  HC:      284.8  mm     G. Age:  31w 2d         91  %    HC/AC:      1.07        0.99 - 1.21  AC:       266   mm     G. Age:  30w 5d         94  %    FL/BPD:     72.8   %    71 - 87  FL:       55.6  mm     G. Age:  29w 2d         56  %    FL/AC:      20.9   %    20 - 24  Est. FW:    1550  gm      3 lb 7 oz     93  % ---------------------------------------------------------------------- OB History  Gravidity:    3         Term:   2        Prem:   0        SAB:   0  TOP:          0       Ectopic:  0        Living: 2 ---------------------------------------------------------------------- Gestational Age  LMP:           28w 4d        Date:  02/15/19                 EDD:   11/22/19  U/S Today:     30w 4d                                        EDD:   11/08/19  Best:  28w 4d     Det. By:  LMP  (02/15/19)          EDD:   11/22/19 ---------------------------------------------------------------------- Anatomy  Cranium:               Appears normal         Aortic Arch:            Previously seen  Cavum:                 Previously seen        Ductal Arch:            Previously seen  Ventricles:            Appears normal         Diaphragm:              Appears normal  Choroid Plexus:        Previously seen        Stomach:                Appears normal, left                                                                         sided  Cerebellum:            Previously seen        Abdomen:                Appears normal  Posterior Fossa:       Previously seen        Abdominal Wall:         Previously seen  Nuchal Fold:           Previously seen        Cord Vessels:           Previously seen  Face:                  Orbits and profile     Kidneys:                Appear normal                         previously seen  Lips:                  Previously seen        Bladder:                Appears normal  Thoracic:              Appears normal         Spine:                  Previously seen  Heart:                 Appears normal         Upper Extremities:      Previously seen                         (4CH, axis, and  situs)  RVOT:                  Previously seen        Lower Extremities:      Previously seen  LVOT:                  Previously seen  Other:  Female gender. Nasal bone previously visualized. Technically difficult          due to maternal habitus and fetal position. RUE not visulaized ---------------------------------------------------------------------- Cervix Uterus Adnexa  Cervix  Not visualized (advanced GA >24wks) ---------------------------------------------------------------------- Comments  This patient was seen for a follow up growth scan due to  maternal obesity, gestational diabetes, and advanced  maternal age.  She denies any problems since her last exam.  She was informed that the fetal growth and amniotic fluid  level appears appropriate for her gestational age.  A follow up exam was scheduled in 4 weeks. ----------------------------------------------------------------------                   Johnell Comings, MD Electronically Signed Final Report   09/03/2019 03:35 pm ----------------------------------------------------------------------   Assessment and Plan:  Pregnancy: O1Y0737 at [redacted]w[redacted]d 1. GDM, class A2 CBGs are within range.  Continue Metformin.  Will start antenatal testing at 32 weeks.  Fetus is LGA, rescan ordered in 4 weeks.  - Hemoglobin A1c  2. Supervision of high risk pregnancy, antepartum Third trimester labs done today. Preterm labor symptoms and general obstetric precautions including but not limited to vaginal bleeding, contractions, leaking of fluid and fetal movement were reviewed in detail with the patient. Please refer to After Visit Summary for other counseling recommendations.   Return in about 2 weeks (around 09/17/2019) for OFFICE [facility] Visit// 4 weeks: NST, BPP, OFFICE HOB Visit.  Future Appointments  Date Time Provider [place]  10/01/2019  1:30 PM WH-MFC [place] 1 WH-MFCUS MFC-US  10/01/2019  1:40 PM [place] MFC-US    Verita Schneiders, MD

## 2019-09-03 NOTE — Patient Instructions (Signed)
AREA PEDIATRIC/FAMILY PRACTICE PHYSICIANS  [place] ([address]) . [facility] o Chambliss, MD; Eniola, MD; Hale, MD; Hensel, MD; McDiarmid, MD; McIntyer, MD; Neal, MD; Walden, MD o [address] [place], [place] [address] o [phone] o Mon-Fri 8:30-12:30, 1:30-5:00 o Providers come to see babies at [facility] o Accepting Medicaid . [facility] o Limited providers who accept newborns: Koirala, MD; Morrow, MD; Wolters, MD o [address], [place], [place] [address] o [phone] o Mon-Fri 8:00-5:30 o Babies seen by providers at [facility] o Does NOT accept Medicaid o Please call early in hospitalization for appointment (limited availability)  . [facility] o Mulberry, MD o [address] [place], [place] [address] o [phone] o Mon, Tue, Thur, Fri 8:30-5:00, Wed 10:00-7:00 (closed 1-2pm) o Babies seen by [facility] providers o Accepting Medicaid . [facility] o Rubin, MD o [address], [place], [place] [address] o [phone] o Mon-Fri 8:30-5:00, Sat 8:30-12:00 o Provider comes to see babies at [facility] o Accepting Medicaid o Must have been referred from current patients or contacted office prior to delivery . [facility] ([facility]) o Brown, MD; Chandler, MD; Ettefagh, MD; Grant, MD; Lester, MD; McCormick, MD; McQueen, MD; Prose, MD; Simha, MD; Stanley, MD; Stryffeler, NP; Tebben, NP o [address], [place], [place] [address] o [phone] o Mon, Tue, Thur, Fri 8:30-5:30, Wed 9:30-5:30, Sat 8:30-12:30 o Babies seen by [facility] providers o Accepting Medicaid o Only accepting infants of first-time parents or siblings of current patients o Hospital discharge coordinator will make follow-up appointment . Jack Amos o [address],  [place], [place]  [address] o [phone]   Fax - [phone] . [facility] o [address], [place], [place]  [address] o Phone - [phone]   Fax - [phone] . Shilpa Gosrani o [address], [place], [place]  [address] o [phone]  [place] ([address]) . [facility] o Bates, MD; Brassfield, MD; Cooper, Cox, MD; MD; Davis, MD; Dovico, MD; Ettefaugh, MD; Little, MD; Lowe, MD; Keiffer, MD; Melvin, MD; Sumner, MD; Williams, MD o [address], [place], [place] [address] o [phone] o Mon-Fri 8:30-5:00 (extended evenings Mon-Thur as needed), Sat-Sun 10:00-1:00 o Providers come to see babies at [facility] o Accepting Medicaid for families of first-time babies and families with all children in the household age 3 and under. Must register with office prior to making appointment (M-F only). . [facility] o Henson, NP; Knapp, MD; Lalonde, MD; Tysinger, PA o [address] [place], [place] [address] o [phone] o Mon-Fri 8:00-5:00 o Babies seen by providers at [facility] o Does NOT accept Medicaid/Commercial Insurance Only . [facility] ([facility])  o Artis, MD; Barnes, MD; Bratton, MD; Coccaro, MD; Lockett [place], MD; Kramer, MD; Marshall, MD; Netherton, MD; Poleto, MD; Skinner, MD o [address] [place], [place] [address] o [phone] o Mon-Fri 8:30-5:30, Sat (Oct.-Mar.) 9:00-1:00 o Babies seen by providers at [facility] o Accepting Medicaid  [place] ([address]) . [facility]  o Reid, MD; Warner, MD o [address], , [place] [address] o [phone] o Mon-Fri 8:30-5:00, Sat 8:30-12:00 o Providers come to see babies at [facility] o Does NOT accept Medicaid . [facility] o Becker, PA; Hagler, MD; Scifres, PA; Sun, MD; Swayne, MD o [address],  [place], [place] [address] o [phone] o Mon-Fri 8:00-5:00 o Babies seen by providers at [facility] o Does NOT accept Medicaid o Only accepting babies of parents who are patients o Please call early in hospitalization for appointment (limited availability) . [facility] o Clark, MD; Frye, MD; Kelleher, MD; Mack, NP; Miller, MD; O'Keller, MD; Patterson, NP; Pudlo, MD; Puzio, MD; Thomas, MD; Tucker, MD; Twiselton, MD o [address], [place], [place] [address] o [phone] o Mon-Fri 8:00-5:00, Sat 9:00-12:00 o Providers come to see babies at [facility] o Does NOT accept Medicaid  [place] ([address]) . [facility] o Limited providers accepting new patients: Brake, NP; Wharton, PA o [address], [place], [place] [address] o [phone] o Mon-Fri 8:00-5:00 o Babies seen by providers at [facility] o Does NOT accept Medicaid o Only accepting babies of parents who are patients o Please call early in hospitalization for appointment (limited availability) . [facility] o Gay, MD; Quinlan, MD o [address] [place], [place] [address] o [phone] (press 1 to schedule appointment) o Mon-Fri 8:00-5:00 o Providers come to see babies at [facility] o Does NOT accept Medicaid . [facility] o Mazer, MD o [address] [place], [place] [address] o [phone] o Mon-Fri 8:30-5:00 (lunch 12:30-1:00), extended hours by appointment only Wed 5:00-6:30 o Babies seen by [facility] providers o Accepting Medicaid . [facility] o Banks, MD; Jordan, MD; Koberlein, MD o [address], [place], [place] [address] o [phone] o Mon-Fri 8:00-5:00 o Babies seen by [facility] providers o Does NOT accept Medicaid . [facility] o Parker, MD; Hunter, MD; Wallace, DO o [address] [place], [place]  [address] o [phone] o Mon-Fri 8:00-5:00 o Babies seen by [facility] providers o Does NOT accept Medicaid . [facility] o Brandon, PA; Brecken, PA; Christy, NP; Dees, MD; DeClaire, MD; DeWeese, MD; Hansen, NP; Mills, NP; Parrish, NP; Smoot, NP; Summer, MD; Vapne, MD o [address] [place], [place] [address] o [phone] o Mon-Fri 8:30-5:00, Sat 10:00-1:00 o Providers come to see babies at [facility] o Does NOT accept Medicaid o Free prenatal information session Tuesdays at 4:45pm . [facility] o Bouska, MD; Gordon, PA; Jeffery, PA; Weber, PA o [address] [place] [address] o [phone] o Mon-Fri 7:30-5:30 o Babies seen by [facility] providers . [facility] o [address], [place], [place]  [address] o [phone]   Fax - [phone]  [place] ([address] & [address]) . [facility] o Reese, MD o [address] [place], [place] [address] o [phone] o Mon-Thur 8:00-6:00 o Providers come to see babies at [facility] o Accepting Medicaid . [facility] o Anderson, NP; Badger, MD; Beal, PA; Spencer, PA o [address] [place], [place] [address] o [phone] o Mon-Thur 7:30-7:30, Fri 7:30-4:30 o Babies seen by [facility] providers o Accepting Medicaid . [facility] o Agbuya, MD; Klett, NP; Romgoolam, MD o [address], [place], [place] [address] o [phone] o Mon-Fri 8:30-5:00, Sat 8:30-12:00 o Providers come to see babies at [facility] o Accepting Medicaid o Must have "Meet & Greet" appointment at office prior to delivery . [facility] ([facility] o McCord,   MD; Wallace, MD; Wood, MD o [address], [place], [place] [address] o [phone] o Mon-Wed 8:00-6:00, Thur-Fri 8:00-5:00, Sat 9:00-12:00 o Providers come to  see babies at [facility] o Does NOT accept Medicaid o Only accepting siblings of current patients . [facility]  o [address], [place], [place]  [address] o [phone]   Fax - [phone] . [facility] o [address], [place], [place]  [address] o [phone]   Fax - [phone]  [place] ([address]) . [facility] o Cirigliano, DO; Matthews, DO o [address] [place], [place] [address] o [phone] o Mon-Fri 7:00-5:00 o Babies seen by [facility] providers o Does NOT accept Medicaid . [facility] o Briscoe, MD; Howley, PA; Moreira, PA o [address], [place], [place] [address] o [phone] o Mon-Fri 8:00-5:00 o Babies seen by [facility] providers o Accepting Medicaid . [facility] o Boyd, MD; Church, PA; Jones, NP; Osborn, PA o [address], [place], [place] [address] o [phone] o Mon-Fri 8:00-5:00 o Babies seen by providers at [facility] o Accepting Medicaid  [place] ([address]) . [facility] [place] o Wendling, DO o [address] [place], [place] [address] o [phone] o Mon-Fri 8:00-5:00 o Babies seen by [facility] providers o Does NOT accept Medicaid o Limited availability, please call early in hospitalization to schedule follow-up . [facility] o Calderon, PA; Cummings, MD; Dillard, MD; Martin, PA; Olson, MD; VanDeven, PA o [address], [place], [place] [address] o [phone] o Mon-Fri 8:30-5:00, Sat 9:00-12:00 o Babies seen by providers at [facility] o Accepting Medicaid o Please register online then schedule online or call office o [email or web address] . [facility] ([facility] o Hunter, NP; Kumar, MD; Martin Rogers, PA o [address], [place], [place] [address] o [phone] o Mon-Fri 8:00-5:00 o Babies seen by providers at [facility] o Accepting Medicaid . [facility] ([facility]) o Melba, MD; Kristi Fleenor, NP; West, MD o [address], [place], [place] [address] o [phone] o Mon-Fri 8:00-5:30, Sat&Sun by appointment (phones open at 8:30) o Babies seen by [facility] providers o Accepting Medicaid o Must be a first-time baby or sibling of current patient . [facility] [place]  o [address], [place], [place]  [address] o [phone]   Fax - [phone]  [place] ([address]) . [facility] o Brown, PA; Cowen, PA; Rice, MD; Helton, PA; Spry, MD o [address] [place], [place] [address] o [phone] o Mon-Thur 8:00-7:00, Fri 8:00-5:00, Sat 8:00-12:00, Sun 9:00-12:00 o Babies seen by [facility] providers o Accepting Medicaid . [facility] o Coe-Goins, MD; Marshall, MD; Pierre-Louis, MD o [address], [place], [place] [address] o [phone] o Mon-Thur 8:00-5:00 o Babies seen by providers at [facility] o Accepting Medicaid . [facility] o Bratton, MD; Coe-Goins, MD; Hayes, MD; Lewis, MD; List, MD; Lott, MD; Marshall, MD; Moran, MD; O'Neal, MD; Pierre-Louis, MD; Pitonzo, MD; Scholer, MD; Spangle, MD o [address] [place],    [address] o [phone] o Mon-Fri 8:00-5:30, Sat (Oct.-Mar.) 9:00-1:00 o Babies seen by providers at [facility] o Accepting Medicaid o Must fill out new patient packet, available online at [email or web address] . [facility] ([facility] o Friddle, NP; Harris, NP; Kelly, NP; Logan, MD;  Melvin, PA; Poth, MD; Ramadoss, MD; Stanton, NP o [address], [place], [place] [address] o [phone] o Mon-Thur 8:00-5:30, Fri 8:00-5:00 o Babies seen by providers at [facility] o Accepting Medicaid  [place] ([address]) . [facility] o Dixon, PA; Houston, MD; Pickard, MD; Tapia, PA o [address], [place], [place] [address] o [phone] o Mon-Fri 8:00-5:00 o Babies seen by providers at [facility] o Accepting Medicaid   [place] ([address]) . [facility] [place] o Masneri, DO; Meyers, MD; Nelson, PA o [address], [place], [place] [address] o [phone] o Mon-Fri 8:00-5:00 o Babies seen by providers at [facility] o Does NOT accept Medicaid o Limited appointment availability, please call early in hospitalization  . [facility] [place] o Kunedd, DO; McGowen, MD o [address], [place], [place] [address] o [phone] o Mon-Fri 8:00-5:00 o Babies seen by [facility] providers o Does NOT accept Medicaid . [facility] [place] o Cameron, MD; MacDonald, MD; Michaels, PA; Nayak, MD o [address] [place] [address], [place], [place] [address] o [phone] o Mon-Fri 8:00-5:00 o After hours clinic ([address] [place], [place] [address]) [phone] Mon-Fri 5:00-8:00, Sat 12:00-6:00, Sun 10:00-4:00 o Babies seen by [facility] providers o Accepting Medicaid . [facility] [place] o [address], [place], [place]  [address] o [phone]   Fax - [phone]  [place] ([address]) . [facility] [place] [facility] o Andy, MD o [address], [place], [place] [address] o [phone] o Mon-Fri 8:00-5:00 o Babies seen by [facility] providers o Does NOT accept Medicaid . [facility] [place] ([facility] [place]) o Eksir, MD o [address], [place], [place]  [address] o [phone] o Mon-Thur 8:00-7:00, Fri 8:00-5:00, Sat 8:00-12:00 o Babies seen by providers at [facility] o Accepting Medicaid - but does not have vaccinations in office (must be received elsewhere) o Limited availability, please call early in hospitalization  [place] ([address]) . [facility]  o Charlene Flemming, MD o [address], [place] [address] o [phone]  Fax [phone]   

## 2019-09-04 LAB — CBC
Hematocrit: 35.8 % (ref 34.0–46.6)
Hemoglobin: 11.6 g/dL (ref 11.1–15.9)
MCH: 29.7 pg (ref 26.6–33.0)
MCHC: 32.4 g/dL (ref 31.5–35.7)
MCV: 92 fL (ref 79–97)
Platelets: 224 10*3/uL (ref 150–450)
RBC: 3.9 x10E6/uL (ref 3.77–5.28)
RDW: 12.9 % (ref 11.7–15.4)
WBC: 8.9 10*3/uL (ref 3.4–10.8)

## 2019-09-04 LAB — RPR: RPR Ser Ql: NONREACTIVE

## 2019-09-04 LAB — HEMOGLOBIN A1C
Est. average glucose Bld gHb Est-mCnc: 100 mg/dL
Hgb A1c MFr Bld: 5.1 % (ref 4.8–5.6)

## 2019-09-04 LAB — HIV ANTIBODY (ROUTINE TESTING W REFLEX): HIV Screen 4th Generation wRfx: NONREACTIVE

## 2019-09-16 ENCOUNTER — Telehealth: Payer: Self-pay | Admitting: Obstetrics & Gynecology

## 2019-09-16 NOTE — Telephone Encounter (Signed)
Spanish interpreter Alyssa [name] called patient about her appointment on 11/18 @ 2:55. Patient instructed to wear a face mask for the entire appointment and no visitors are allowed. Patient screened for covid symptoms and denied having any.

## 2019-09-17 ENCOUNTER — Other Ambulatory Visit: Payer: Self-pay

## 2019-09-17 ENCOUNTER — Ambulatory Visit (INDEPENDENT_AMBULATORY_CARE_PROVIDER_SITE_OTHER): Payer: Self-pay | Admitting: Obstetrics & Gynecology

## 2019-09-17 VITALS — Wt 219.3 lb

## 2019-09-17 DIAGNOSIS — O3663X0 Maternal care for excessive fetal growth, third trimester, not applicable or unspecified: Secondary | ICD-10-CM

## 2019-09-17 DIAGNOSIS — O0993 Supervision of high risk pregnancy, unspecified, third trimester: Secondary | ICD-10-CM

## 2019-09-17 DIAGNOSIS — Z3A30 30 weeks gestation of pregnancy: Secondary | ICD-10-CM

## 2019-09-17 DIAGNOSIS — O09523 Supervision of elderly multigravida, third trimester: Secondary | ICD-10-CM

## 2019-09-17 DIAGNOSIS — O99210 Obesity complicating pregnancy, unspecified trimester: Secondary | ICD-10-CM

## 2019-09-17 DIAGNOSIS — O24419 Gestational diabetes mellitus in pregnancy, unspecified control: Secondary | ICD-10-CM

## 2019-09-17 DIAGNOSIS — O99213 Obesity complicating pregnancy, third trimester: Secondary | ICD-10-CM

## 2019-09-17 DIAGNOSIS — Z789 Other specified health status: Secondary | ICD-10-CM

## 2019-09-17 DIAGNOSIS — O09522 Supervision of elderly multigravida, second trimester: Secondary | ICD-10-CM

## 2019-09-17 DIAGNOSIS — O3662X0 Maternal care for excessive fetal growth, second trimester, not applicable or unspecified: Secondary | ICD-10-CM

## 2019-09-17 DIAGNOSIS — O0990 Supervision of high risk pregnancy, unspecified, unspecified trimester: Secondary | ICD-10-CM

## 2019-09-17 NOTE — Progress Notes (Signed)
   PRENATAL VISIT NOTE  Subjective:  Alyssa [name] is a 33 y.o. G3P2002 at [redacted]w[redacted]d being seen today for ongoing prenatal care.  She is currently monitored for the following issues for this high-risk pregnancy and has GDM, class A2; Supervision of high risk pregnancy, antepartum; Language barrier; AMA (advanced maternal age) multigravida 35+; and Excessive fetal growth affecting management of mother in second trimester, antepartum on their problem list.  Patient reports no complaints.  Contractions: Not present. Vag. Bleeding: None.  Movement: Present. Denies leaking of fluid.   The following portions of the patient's history were reviewed and updated as appropriate: allergies, current medications, past family history, past medical history, past social history, past surgical history and problem list.   Objective:   Vitals:   09/17/19 1459  Weight: 219 lb 4.8 oz (99.5 kg)    Fetal Status: Fetal Heart Rate (bpm): 142   Movement: Present     General:  Alert, oriented and cooperative. Patient is in no acute distress.  Skin: Skin is warm and dry. No rash noted.   Cardiovascular: Normal heart rate noted  Respiratory: Normal respiratory effort, no problems with respiration noted  Abdomen: Soft, gravid, appropriate for gestational age.  Pain/Pressure: Absent     Pelvic: Cervical exam deferred        Extremities: Normal range of motion.  Edema: None  Mental Status: Normal mood and affect. Normal behavior. Normal judgment and thought content.   Assessment and Plan:  Pregnancy: G3P2002 at [redacted]w[redacted]d 1. GDM, class A2 - on metformin 500 mg qhs with excellent sugars - on baby asa - start weekly BPP at 32 week  2. Supervision of high risk pregnancy, antepartum   3. Language barrier - live interpretor present for visit  4. Excessive fetal growth affecting management of pregnancy in second trimester, single or unspecified fetus  5. Multigravida of advanced maternal age in second trimester    6. Obesity in pregnancy  Preterm labor symptoms and general obstetric precautions including but not limited to vaginal bleeding, contractions, leaking of fluid and fetal movement were reviewed in detail with the patient. Please refer to After Visit Summary for other counseling recommendations.   No follow-ups on file.  Future Appointments  Date Time Provider [place]  10/01/2019 12:45 PM [place] NURSE [place] [facility]-US  10/01/2019 12:45 PM [place] 5 WH-MFCUS MFC-US  10/01/2019  2:15 PM [facility]-WOCA NST WOC-WOCA WOC  10/01/2019  3:15 PM Aletha Halim, MD WOC-WOCA WOC    Emily Filbert, MD

## 2019-09-17 NOTE — Progress Notes (Signed)
Spanish Interpreter Eda R.,  

## 2019-09-30 ENCOUNTER — Other Ambulatory Visit: Payer: Self-pay | Admitting: *Deleted

## 2019-09-30 DIAGNOSIS — O0990 Supervision of high risk pregnancy, unspecified, unspecified trimester: Secondary | ICD-10-CM

## 2019-09-30 DIAGNOSIS — O24419 Gestational diabetes mellitus in pregnancy, unspecified control: Secondary | ICD-10-CM

## 2019-10-01 ENCOUNTER — Ambulatory Visit (INDEPENDENT_AMBULATORY_CARE_PROVIDER_SITE_OTHER): Payer: Self-pay | Admitting: Obstetrics and Gynecology

## 2019-10-01 ENCOUNTER — Ambulatory Visit (HOSPITAL_COMMUNITY): Payer: Self-pay

## 2019-10-01 ENCOUNTER — Ambulatory Visit (HOSPITAL_COMMUNITY): Payer: Self-pay | Admitting: *Deleted

## 2019-10-01 ENCOUNTER — Ambulatory Visit (HOSPITAL_COMMUNITY)
Admission: RE | Admit: 2019-10-01 | Discharge: 2019-10-01 | Disposition: A | Discharge status: Home / Self Care | Payer: Self-pay | Source: Ambulatory Visit | Attending: Obstetrics | Admitting: Obstetrics

## 2019-10-01 ENCOUNTER — Ambulatory Visit (INDEPENDENT_AMBULATORY_CARE_PROVIDER_SITE_OTHER): Payer: Self-pay | Admitting: *Deleted

## 2019-10-01 ENCOUNTER — Encounter (HOSPITAL_COMMUNITY): Payer: Self-pay

## 2019-10-01 ENCOUNTER — Other Ambulatory Visit: Payer: Self-pay

## 2019-10-01 ENCOUNTER — Other Ambulatory Visit (HOSPITAL_COMMUNITY): Payer: Self-pay | Admitting: *Deleted

## 2019-10-01 VITALS — BP 111/60 | HR 82 | Wt 214.3 lb

## 2019-10-01 DIAGNOSIS — O0993 Supervision of high risk pregnancy, unspecified, third trimester: Secondary | ICD-10-CM

## 2019-10-01 DIAGNOSIS — O3663X0 Maternal care for excessive fetal growth, third trimester, not applicable or unspecified: Secondary | ICD-10-CM

## 2019-10-01 DIAGNOSIS — Z362 Encounter for other antenatal screening follow-up: Secondary | ICD-10-CM

## 2019-10-01 DIAGNOSIS — O09529 Supervision of elderly multigravida, unspecified trimester: Secondary | ICD-10-CM

## 2019-10-01 DIAGNOSIS — O99213 Obesity complicating pregnancy, third trimester: Secondary | ICD-10-CM

## 2019-10-01 DIAGNOSIS — Z3A32 32 weeks gestation of pregnancy: Secondary | ICD-10-CM

## 2019-10-01 DIAGNOSIS — O09523 Supervision of elderly multigravida, third trimester: Secondary | ICD-10-CM

## 2019-10-01 DIAGNOSIS — O0990 Supervision of high risk pregnancy, unspecified, unspecified trimester: Secondary | ICD-10-CM | POA: Insufficient documentation

## 2019-10-01 DIAGNOSIS — Z789 Other specified health status: Secondary | ICD-10-CM

## 2019-10-01 DIAGNOSIS — O24415 Gestational diabetes mellitus in pregnancy, controlled by oral hypoglycemic drugs: Secondary | ICD-10-CM

## 2019-10-01 DIAGNOSIS — O24419 Gestational diabetes mellitus in pregnancy, unspecified control: Secondary | ICD-10-CM | POA: Insufficient documentation

## 2019-10-01 NOTE — Progress Notes (Signed)
Prenatal Visit Note Date: 10/01/2019 Clinic: [facility]  Subjective:  Alyssa [name] is a 33 y.o. G3P2002 at [redacted]w[redacted]d being seen today for ongoing prenatal care.  She is currently monitored for the following issues for this high-risk pregnancy and has GDM, class A2; Supervision of high risk pregnancy, antepartum; Language barrier; and AMA (advanced maternal age) multigravida 35+ on their problem list.  Patient reports no complaints.   Contractions: Not present. Vag. Bleeding: None.  Movement: Present. Denies leaking of fluid.   The following portions of the patient's history were reviewed and updated as appropriate: allergies, current medications, past family history, past medical history, past social history, past surgical history and problem list. Problem list updated.  Objective:   Vitals:   10/01/19 1421  BP: 111/60  Pulse: 82  Weight: 214 lb 4.8 oz (97.2 kg)    Fetal Status: Fetal Heart Rate (bpm): NST   Movement: Present     General:  Alert, oriented and cooperative. Patient is in no acute distress.  Skin: Skin is warm and dry. No rash noted.   Cardiovascular: Normal heart rate noted  Respiratory: Normal respiratory effort, no problems with respiration noted  Abdomen: Soft, gravid, appropriate for gestational age. Pain/Pressure: Absent     Pelvic:  Cervical exam deferred        Extremities: Normal range of motion.  Edema: None  Mental Status: Normal mood and affect. Normal behavior. Normal judgment and thought content.   Urinalysis:      Assessment and Plan:  Pregnancy: G3P2002 at [redacted]w[redacted]d  1. Supervision of high risk pregnancy, antepartum Routine care. Ask more about BC nv  2. GDM, class A2 Normal BS log on metformin 500 qhs. bpp 10/10 and normal growth/ac today  3. Language barrier Interpreter used  4. Multigravida of advanced maternal age in third trimester No issues  Preterm labor symptoms and general obstetric precautions including  but not limited to vaginal bleeding, contractions, leaking of fluid and fetal movement were reviewed in detail with the patient. Please refer to After Visit Summary for other counseling recommendations.  Return in about 1 week (around 10/08/2019) for nst with diane. 2wk hrob in person and nst with diane.   Aletha Halim, MD

## 2019-10-01 NOTE — Progress Notes (Signed)
Interpreter Eda royal present for encounter.  Pt had [place] for growth/BPP @ MFM today.  She is scheduled for weekly BPP @ [facility].

## 2019-10-02 ENCOUNTER — Encounter: Payer: Self-pay | Admitting: Obstetrics & Gynecology

## 2019-10-02 ENCOUNTER — Other Ambulatory Visit: Payer: Self-pay

## 2019-10-08 ENCOUNTER — Encounter (HOSPITAL_COMMUNITY): Payer: Self-pay

## 2019-10-08 ENCOUNTER — Other Ambulatory Visit: Payer: Self-pay

## 2019-10-08 ENCOUNTER — Ambulatory Visit (HOSPITAL_COMMUNITY)
Admission: RE | Admit: 2019-10-08 | Discharge: 2019-10-08 | Disposition: A | Discharge status: Home / Self Care | Payer: Self-pay | Source: Ambulatory Visit | Attending: Obstetrics and Gynecology | Admitting: Obstetrics and Gynecology

## 2019-10-08 ENCOUNTER — Ambulatory Visit (INDEPENDENT_AMBULATORY_CARE_PROVIDER_SITE_OTHER): Payer: PRIVATE HEALTH INSURANCE | Admitting: *Deleted

## 2019-10-08 ENCOUNTER — Ambulatory Visit (HOSPITAL_COMMUNITY): Payer: Self-pay | Admitting: *Deleted

## 2019-10-08 VITALS — BP 100/60 | HR 96 | Wt 214.4 lb

## 2019-10-08 DIAGNOSIS — O0990 Supervision of high risk pregnancy, unspecified, unspecified trimester: Secondary | ICD-10-CM | POA: Insufficient documentation

## 2019-10-08 DIAGNOSIS — O24419 Gestational diabetes mellitus in pregnancy, unspecified control: Secondary | ICD-10-CM

## 2019-10-08 DIAGNOSIS — Z3A33 33 weeks gestation of pregnancy: Secondary | ICD-10-CM

## 2019-10-08 DIAGNOSIS — O24415 Gestational diabetes mellitus in pregnancy, controlled by oral hypoglycemic drugs: Secondary | ICD-10-CM

## 2019-10-08 DIAGNOSIS — O09529 Supervision of elderly multigravida, unspecified trimester: Secondary | ICD-10-CM

## 2019-10-08 DIAGNOSIS — O09523 Supervision of elderly multigravida, third trimester: Secondary | ICD-10-CM

## 2019-10-08 DIAGNOSIS — O99213 Obesity complicating pregnancy, third trimester: Secondary | ICD-10-CM

## 2019-10-08 DIAGNOSIS — O3663X0 Maternal care for excessive fetal growth, third trimester, not applicable or unspecified: Secondary | ICD-10-CM

## 2019-10-15 ENCOUNTER — Ambulatory Visit (HOSPITAL_COMMUNITY): Admission: RE | Admit: 2019-10-15 | Payer: Self-pay | Source: Ambulatory Visit

## 2019-10-15 ENCOUNTER — Encounter: Payer: Self-pay | Admitting: Obstetrics & Gynecology

## 2019-10-15 ENCOUNTER — Ambulatory Visit (HOSPITAL_COMMUNITY): Payer: Self-pay

## 2019-10-15 ENCOUNTER — Other Ambulatory Visit: Payer: Self-pay

## 2019-10-21 ENCOUNTER — Ambulatory Visit (INDEPENDENT_AMBULATORY_CARE_PROVIDER_SITE_OTHER): Payer: Self-pay | Admitting: Obstetrics & Gynecology

## 2019-10-21 DIAGNOSIS — Z3A35 35 weeks gestation of pregnancy: Secondary | ICD-10-CM

## 2019-10-21 DIAGNOSIS — O3662X0 Maternal care for excessive fetal growth, second trimester, not applicable or unspecified: Secondary | ICD-10-CM

## 2019-10-21 DIAGNOSIS — O09523 Supervision of elderly multigravida, third trimester: Secondary | ICD-10-CM

## 2019-10-21 DIAGNOSIS — Z789 Other specified health status: Secondary | ICD-10-CM

## 2019-10-21 DIAGNOSIS — O24419 Gestational diabetes mellitus in pregnancy, unspecified control: Secondary | ICD-10-CM

## 2019-10-21 DIAGNOSIS — O0990 Supervision of high risk pregnancy, unspecified, unspecified trimester: Secondary | ICD-10-CM

## 2019-10-21 DIAGNOSIS — Z603 Acculturation difficulty: Secondary | ICD-10-CM

## 2019-10-21 DIAGNOSIS — O0993 Supervision of high risk pregnancy, unspecified, third trimester: Secondary | ICD-10-CM

## 2019-10-21 NOTE — Progress Notes (Signed)
[facility] VIRTUAL VIDEO VISIT ENCOUNTER NOTE  Provider location: [facility] [place]   I connected with Alyssa [name] on 10/21/19 at  8:25 AM EST by MyChart Video Encounter at home and verified that I am speaking with the correct person using two identifiers.   I discussed the limitations, risks, security and privacy concerns of performing an evaluation and management service virtually and the availability of in person appointments. I also discussed with the patient that there may be a patient responsible charge related to this service. The patient expressed understanding and agreed to proceed. Subjective:  Alyssa [name] is a 33 y.o. G3P2002 at [redacted]w[redacted]d being seen today for ongoing prenatal care.  She is currently monitored for the following issues for this high-risk pregnancy and has GDM, class A2; Supervision of high risk pregnancy, antepartum; Language barrier; and AMA (advanced maternal age) multigravida 35+ on their problem list.  Patient reports no complaints.  Contractions: Not present. Vag. Bleeding: None.  Movement: Present. Denies any leaking of fluid.   The following portions of the patient's history were reviewed and updated as appropriate: allergies, current medications, past family history, past medical history, past social history, past surgical history and problem list.   Objective:  There were no vitals filed for this visit.  Fetal Status:     Movement: Present     General:  Alert, oriented and cooperative. Patient is in no acute distress.  Respiratory: Normal respiratory effort, no problems with respiration noted  Mental Status: Normal mood and affect. Normal behavior. Normal judgment and thought content.  Rest of physical exam deferred due to type of encounter  Imaging: [place] MFM FETAL BPP WO NON STRESS  Result Date: 10/09/2019 ----------------------------------------------------------------------  OBSTETRICS  REPORT                        (Signed Final 10/09/2019 10:57 am) ---------------------------------------------------------------------- Patient Info  ID #:       [id number]                          D.O.B.:  05/20/1986 (32 yrs)  Name:       Alyssa Mcardle-               Visit Date: 10/08/2019 03:27 pm              VARGAS ---------------------------------------------------------------------- Performed By  Performed By:     Valda Favia          Ref. Address:      [address], [place]  [address]  Attending:        Lin Landsman      [facility]                    MD                                        [facility]  Referred By:      [facility]-                    Faculty Physician ---------------------------------------------------------------------- Orders   #  Description                          Code         Ordered By   1  [place] MFM FETAL BPP WO NON              [phone]     YU FANG      STRESS  ----------------------------------------------------------------------   #  Order #                    Accession #                 Episode #   1  [id number]                  [id number]                  [id number]  ---------------------------------------------------------------------- Indications   [redacted] weeks gestation of pregnancy                Z3A.33   Gestational diabetes in pregnancy,             O24.415   controlled by oral hypoglycemic drugs   Advanced maternal age multigravida 26+,        O69.523   third trimester   Obesity complicating pregnancy, third          O99.213   trimester   Maternal care for excessive fetal growth,      O36.63X0   third trimester, fetus unspecified   Encounter for other antenatal screening        Z36.2   follow-up   ---------------------------------------------------------------------- Vital Signs                                                 Height:        5'2" ---------------------------------------------------------------------- Fetal Evaluation  Num Of Fetuses:          1  Fetal Heart Rate(bpm):   132  Cardiac Activity:        Observed  Presentation:            Cephalic  Amniotic Fluid  AFI FV:      Within normal limits  AFI Sum(cm)     %Tile       Largest Pocket(cm)  17.92           66  5.99  RUQ(cm)       RLQ(cm)       LUQ(cm)        LLQ(cm)  4.49          4.67          5.99           2.77 ---------------------------------------------------------------------- Biophysical Evaluation  Amniotic F.V:   Within normal limits       F. Tone:         Observed  F. Movement:    Observed                   Score:           8/8  F. Breathing:   Observed ---------------------------------------------------------------------- OB History  Gravidity:    3         Term:   2        Prem:   0        SAB:   0  TOP:          0       Ectopic:  0        Living: 2 ---------------------------------------------------------------------- Gestational Age  LMP:           33w 4d        Date:  02/15/19                 EDD:   11/22/19  Best:          33w 4d     Det. By:  LMP  (02/15/19)          EDD:   11/22/19 ---------------------------------------------------------------------- Anatomy  Thoracic:              Appears normal         Bladder:                Appears normal  Stomach:               Appears normal, left                         sided ---------------------------------------------------------------------- Impression  Biophysical profile 8/8  A2GDM  AMA ---------------------------------------------------------------------- Recommendations  Continue weekly BPP  Follow up growth in 3 weeks. ----------------------------------------------------------------------               Lin Landsmanorenthian Booker, MD Electronically Signed Final Report    10/09/2019 10:57 am ----------------------------------------------------------------------  US MFM FETAL BPP WO NON STRESS  Result Date: 10/01/2019 ----------------------------------------------------------------------  OBSTETRICS REPORT                       (Signed Final 10/01/2019 01:54 pm) ---------------------------------------------------------------------- Patient Info  ID #:       [id number]                          D.O.B.:  [birth year] (32 yrs)  Name:       Tommie SamsHORTENCIA ROBLES-               Visit Date: 10/01/2019 01:06 pm              VARGAS ---------------------------------------------------------------------- Performed By  Performed By:     Earley BrookeNicole S Dalrymple     Ref. Address:     [address]  [address]                                                             [place], [place]                                                             [address]  Attending:        Ma Rings MD         [facility]                                                             [facility]  Referred By:      [facility]-                    Faculty Physician ---------------------------------------------------------------------- Orders   #  Description                          Code         Ordered By   1  [place] MFM OB FOLLOW UP                  [phone]     YU FANG   2  [place] MFM FETAL BPP WO NON              76819.01     CHARLIE PICKENS      STRESS  ----------------------------------------------------------------------   #  Order #                    Accession #                 Episode #   1  [id number]                  [id number]                  [id number]   2  [id number]                  [id number]                  [id number]  ---------------------------------------------------------------------- Indications   Gestational diabetes in pregnancy,             O24.415   controlled by oral  hypoglycemic drugs   Advanced maternal age multigravida 63+,        O12.523   third trimester   Obesity complicating pregnancy, third          O14.213   trimester   [redacted] weeks gestation of pregnancy                Z3A.32   Maternal care for excessive fetal growth,      O36.63X0   third trimester, fetus unspecified   Encounter for other antenatal screening        Z36.2   follow-up  ---------------------------------------------------------------------- Vital Signs                                                 Height:        5'2" ---------------------------------------------------------------------- Fetal Evaluation  Num Of Fetuses:         1  Fetal Heart Rate(bpm):  137  Cardiac Activity:       Observed  Presentation:           Cephalic  Placenta:               Posterior  P. Cord Insertion:      Previously Visualized  Amniotic Fluid  AFI FV:      Within normal limits  AFI Sum(cm)     %Tile       Largest Pocket(cm)  20.9            79          6.22  RUQ(cm)       RLQ(cm)       LUQ(cm)        LLQ(cm)  5.32          6.22          5.21           4.15 ---------------------------------------------------------------------- Biophysical Evaluation  Amniotic F.V:   Within normal limits       F. Tone:        Observed  F. Movement:    Observed                   Score:          8/8  F. Breathing:   Observed ---------------------------------------------------------------------- Biometry  BPD:      84.1  mm     G. Age:  33w 6d         79  %    CI:        73.57   %    70 - 86                                                          FL/HC:      20.1   %    19.9 - 21.5  HC:      311.5  mm     G. Age:  34w 6d         73  %    HC/AC:      1.05        0.96 - 1.11  AC:      295.9  mm     G. Age:  33w 4d         78  %    FL/BPD:     74.3   %    71 - 87  FL:       62.5  mm     G. Age:  32w 3d         32  %    FL/AC:      21.1   %    20 - 24  HUM:      56.5  mm     G. Age:  32w 6d         60  %  LV:        8.7  mm  Est. FW:    2183  gm    4  lb 13 oz      66  % ---------------------------------------------------------------------- OB History  Gravidity:    3         Term:   2        Prem:   0        SAB:   0  TOP:          0       Ectopic:  0        Living: 2 ---------------------------------------------------------------------- Gestational Age  LMP:           32w 4d        Date:  02/15/19                 EDD:   11/22/19  U/S Today:     33w 5d                                        EDD:   11/14/19  Best:          32w 4d     Det. By:  LMP  (02/15/19)          EDD:   11/22/19 ---------------------------------------------------------------------- Anatomy  Cranium:               Appears normal         Aortic Arch:            Previously seen  Cavum:                 Previously seen        Ductal Arch:            Previously seen  Ventricles:            Appears normal         Diaphragm:              Appears normal  Choroid Plexus:        Previously seen        Stomach:                Appears normal, left                                                                        sided  Cerebellum:  Previously seen        Abdomen:                Appears normal  Posterior Fossa:       Previously seen        Abdominal Wall:         Previously seen  Nuchal Fold:           Previously seen        Cord Vessels:           Previously seen  Face:                  Orbits and profile     Kidneys:                Appear normal                         previously seen  Lips:                  Previously seen        Bladder:                Appears normal  Thoracic:              Appears normal         Spine:                  Previously seen  Heart:                 Appears normal         Upper Extremities:      Previously seen                         (4CH, axis, and                         situs)  RVOT:                  Previously seen        Lower Extremities:      Previously seen  LVOT:                  Previously seen  Other:  Female gender previously seen. Nasal bone  previously visualized.          Technically difficult due to maternal habitus and fetal position. ---------------------------------------------------------------------- Cervix Uterus Adnexa  Cervix  Not visualized (advanced GA >24wks) ---------------------------------------------------------------------- Comments  This patient was seen for a follow up growth scan due to A2  gestational diabetes that is currently treated with Metformin.  She denies any problems since her last exam.  She was informed that the fetal growth and amniotic fluid  level appears appropriate for her gestational age.  Biophysical profile performed today was 8 out of 8.  Due to A2 gestational diabetes, we will continue to follow the  patient with weekly fetal testing.  A follow-up growth scan will  be scheduled in 4 weeks. ----------------------------------------------------------------------                   Ma Rings, MD Electronically Signed Final Report   10/01/2019 01:54 pm ----------------------------------------------------------------------  [place] MFM OB FOLLOW UP  Result Date: 10/01/2019 ----------------------------------------------------------------------  OBSTETRICS REPORT                       (  Signed Final 10/01/2019 01:54 pm) ---------------------------------------------------------------------- Patient Info  ID #:       [id number]                          D.O.B.:  May 20, 1986 (32 yrs)  Name:       Tommie Sams-               Visit Date: 10/01/2019 01:06 pm              VARGAS ---------------------------------------------------------------------- Performed By  Performed By:     Earley Brooke     Ref. Address:     [address]                    [address]                                                             [place], [place]                                                             [address]  Attending:        Ma Rings MD         [facility]  Referred By:      [facility]-                    Faculty Physician ---------------------------------------------------------------------- Orders   #  Description                          Code         Ordered By   1  [place] MFM OB FOLLOW UP                  [phone]     YU FANG   2  [place] MFM FETAL BPP WO NON              95621.30     CHARLIE PICKENS      STRESS  ----------------------------------------------------------------------   #  Order #  Accession #                 Episode #   1  [id number]                  [id number]                  [id number]   2  [id number]                  [id number]                  [id number]  ---------------------------------------------------------------------- Indications   Gestational diabetes in pregnancy,             O24.415   controlled by oral hypoglycemic drugs   Advanced maternal age multigravida 40+,        O5.523   third trimester   Obesity complicating pregnancy, third          O99.213   trimester   [redacted] weeks gestation of pregnancy                Z3A.32   Maternal care for excessive fetal growth,      O36.63X0   third trimester, fetus unspecified   Encounter for other antenatal screening        Z36.2   follow-up  ---------------------------------------------------------------------- Vital Signs                                                 Height:        5'2" ---------------------------------------------------------------------- Fetal Evaluation  Num Of Fetuses:         1  Fetal Heart Rate(bpm):  137  Cardiac Activity:       Observed  Presentation:           Cephalic  Placenta:               Posterior  P. Cord Insertion:      Previously Visualized  Amniotic Fluid  AFI FV:      Within normal limits  AFI Sum(cm)     %Tile       Largest Pocket(cm)  20.9            79          6.22  RUQ(cm)       RLQ(cm)       LUQ(cm)        LLQ(cm)  5.32           6.22          5.21           4.15 ---------------------------------------------------------------------- Biophysical Evaluation  Amniotic F.V:   Within normal limits       F. Tone:        Observed  F. Movement:    Observed                   Score:          8/8  F. Breathing:   Observed ---------------------------------------------------------------------- Biometry  BPD:      84.1  mm     G. Age:  33w 6d         79  %    CI:        73.57   %    70 - 86  FL/HC:      20.1   %    19.9 - 21.5  HC:      311.5  mm     G. Age:  34w 6d         73  %    HC/AC:      1.05        0.96 - 1.11  AC:      295.9  mm     G. Age:  33w 4d         78  %    FL/BPD:     74.3   %    71 - 87  FL:       62.5  mm     G. Age:  32w 3d         32  %    FL/AC:      21.1   %    20 - 24  HUM:      56.5  mm     G. Age:  32w 6d         60  %  LV:        8.7  mm  Est. FW:    2183  gm    4 lb 13 oz      66  % ---------------------------------------------------------------------- OB History  Gravidity:    3         Term:   2        Prem:   0        SAB:   0  TOP:          0       Ectopic:  0        Living: 2 ---------------------------------------------------------------------- Gestational Age  LMP:           32w 4d        Date:  02/15/19                 EDD:   11/22/19  U/S Today:     33w 5d                                        EDD:   11/14/19  Best:          32w 4d     Det. By:  LMP  (02/15/19)          EDD:   11/22/19 ---------------------------------------------------------------------- Anatomy  Cranium:               Appears normal         Aortic Arch:            Previously seen  Cavum:                 Previously seen        Ductal Arch:            Previously seen  Ventricles:            Appears normal         Diaphragm:              Appears normal  Choroid Plexus:        Previously seen        Stomach:                Appears normal, left  sided  Cerebellum:            Previously seen        Abdomen:                Appears normal  Posterior Fossa:       Previously seen        Abdominal Wall:         Previously seen  Nuchal Fold:           Previously seen        Cord Vessels:           Previously seen  Face:                  Orbits and profile     Kidneys:                Appear normal                         previously seen  Lips:                  Previously seen        Bladder:                Appears normal  Thoracic:              Appears normal         Spine:                  Previously seen  Heart:                 Appears normal         Upper Extremities:      Previously seen                         (4CH, axis, and                         situs)  RVOT:                  Previously seen        Lower Extremities:      Previously seen  LVOT:                  Previously seen  Other:  Female gender previously seen. Nasal bone previously visualized.          Technically difficult due to maternal habitus and fetal position. ---------------------------------------------------------------------- Cervix Uterus Adnexa  Cervix  Not visualized (advanced GA >24wks) ---------------------------------------------------------------------- Comments  This patient was seen for a follow up growth scan due to A2  gestational diabetes that is currently treated with Metformin.  She denies any problems since her last exam.  She was informed that the fetal growth and amniotic fluid  level appears appropriate for her gestational age.  Biophysical profile performed today was 8 out of 8.  Due to A2 gestational diabetes, we will continue to follow the  patient with weekly fetal testing.  A follow-up growth scan will  be scheduled in 4 weeks. ----------------------------------------------------------------------                   Ma Rings, MD Electronically Signed Final Report   10/01/2019 01:54 pm  ----------------------------------------------------------------------   Assessment and Plan:  Pregnancy: [id number] at [redacted]w[redacted]d 1. Supervision of high risk pregnancy, antepartum -  she has not bought new batteries for her non-functioning BP cuff. I advised her to do so.  2. GDM, class A2 - excellent sugars on metformin 500mg  qhs  3. Multigravida of advanced maternal age in third trimester - she declined genetic testing  4. Excessive fetal growth affecting management of pregnancy in second trimester, single or unspecified fetus - She has weekly BPPs and another growth ultrasound next week  5. Language barrier - Spanish interpretor  Preterm labor symptoms and general obstetric precautions including but not limited to vaginal bleeding, contractions, leaking of fluid and fetal movement were reviewed in detail with the patient. I discussed the assessment and treatment plan with the patient. The patient was provided an opportunity to ask questions and all were answered. The patient agreed with the plan and demonstrated an understanding of the instructions. The patient was advised to call back or seek an in-person office evaluation/go to [facility] at [facility] for any urgent or concerning symptoms. Please refer to After Visit Summary for other counseling recommendations.   I provided 10 minutes of face-to-face time during this encounter.  Return in about 1 week (around 10/28/2019) for in person for cultures.  Future Appointments  Date Time Provider Department Center  10/22/2019  1:15 PM WH-MFC NST [facility] MFC-US  10/22/2019  2:30 PM WH-MFC [place] 1 WH-MFCUS MFC-US  10/22/2019  2:40 PM WH-MFC NURSE WH-MFC MFC-US  10/28/2019  8:55 AM Levie Heritage, DO WOC-WOCA WOC  10/29/2019  1:15 PM WH-MFC NST WH-MFC MFC-US  10/29/2019  2:30 PM WH-MFC [place] 1 WH-MFCUS MFC-US  10/29/2019  2:40 PM WH-MFC NURSE WH-MFC MFC-US    Allie Bossier, MD [facility] [employer], [facility]

## 2019-10-21 NOTE — Progress Notes (Signed)
I connected with  Alyssa [name] on 10/21/19 at  8:25 AM EST by telephone with [facility] interpreter [place] [phone] and verified that I am speaking with the correct person using two identifiers.   I discussed the limitations, risks, security and privacy concerns of performing an evaluation and management service by telephone and the availability of in person appointments. I also discussed with the patient that there may be a patient responsible charge related to this service. The patient expressed understanding and agreed to proceed.  Pt reports BP cuff needs new batteries; denies any headache, blurry vision, or dizziness.  Annabell Howells, RN 10/21/2019  8:41 AM

## 2019-10-22 ENCOUNTER — Ambulatory Visit (HOSPITAL_COMMUNITY): Payer: Self-pay

## 2019-10-22 ENCOUNTER — Encounter (HOSPITAL_COMMUNITY): Payer: Self-pay

## 2019-10-22 ENCOUNTER — Ambulatory Visit (HOSPITAL_COMMUNITY)
Admission: RE | Admit: 2019-10-22 | Discharge: 2019-10-22 | Disposition: A | Discharge status: Home / Self Care | Payer: Self-pay | Source: Ambulatory Visit | Attending: Obstetrics and Gynecology | Admitting: Obstetrics and Gynecology

## 2019-10-22 ENCOUNTER — Ambulatory Visit (HOSPITAL_COMMUNITY): Payer: Self-pay | Admitting: *Deleted

## 2019-10-22 ENCOUNTER — Other Ambulatory Visit: Payer: Self-pay

## 2019-10-22 ENCOUNTER — Other Ambulatory Visit (HOSPITAL_COMMUNITY): Payer: Self-pay | Admitting: *Deleted

## 2019-10-22 DIAGNOSIS — O24415 Gestational diabetes mellitus in pregnancy, controlled by oral hypoglycemic drugs: Secondary | ICD-10-CM

## 2019-10-22 DIAGNOSIS — O0990 Supervision of high risk pregnancy, unspecified, unspecified trimester: Secondary | ICD-10-CM | POA: Insufficient documentation

## 2019-10-22 DIAGNOSIS — O09529 Supervision of elderly multigravida, unspecified trimester: Secondary | ICD-10-CM

## 2019-10-22 DIAGNOSIS — O09523 Supervision of elderly multigravida, third trimester: Secondary | ICD-10-CM | POA: Insufficient documentation

## 2019-10-22 DIAGNOSIS — Z3A35 35 weeks gestation of pregnancy: Secondary | ICD-10-CM

## 2019-10-22 DIAGNOSIS — O99213 Obesity complicating pregnancy, third trimester: Secondary | ICD-10-CM

## 2019-10-22 DIAGNOSIS — O3663X0 Maternal care for excessive fetal growth, third trimester, not applicable or unspecified: Secondary | ICD-10-CM

## 2019-10-28 ENCOUNTER — Ambulatory Visit (INDEPENDENT_AMBULATORY_CARE_PROVIDER_SITE_OTHER): Payer: Self-pay | Admitting: Family Medicine

## 2019-10-28 ENCOUNTER — Other Ambulatory Visit: Payer: Self-pay

## 2019-10-28 VITALS — BP 114/81 | HR 101 | Wt 213.0 lb

## 2019-10-28 DIAGNOSIS — O09523 Supervision of elderly multigravida, third trimester: Secondary | ICD-10-CM

## 2019-10-28 DIAGNOSIS — Z3A36 36 weeks gestation of pregnancy: Secondary | ICD-10-CM

## 2019-10-28 DIAGNOSIS — O24419 Gestational diabetes mellitus in pregnancy, unspecified control: Secondary | ICD-10-CM

## 2019-10-28 DIAGNOSIS — Z113 Encounter for screening for infections with a predominantly sexual mode of transmission: Secondary | ICD-10-CM

## 2019-10-28 DIAGNOSIS — O0990 Supervision of high risk pregnancy, unspecified, unspecified trimester: Secondary | ICD-10-CM

## 2019-10-28 DIAGNOSIS — Z789 Other specified health status: Secondary | ICD-10-CM

## 2019-10-28 DIAGNOSIS — O0993 Supervision of high risk pregnancy, unspecified, third trimester: Secondary | ICD-10-CM

## 2019-10-28 MED ORDER — METFORMIN HCL 500 MG PO TABS: 500.0000 mg | ORAL_TABLET | Freq: Every day | ORAL | 3 refills | Status: DC

## 2019-10-28 NOTE — Progress Notes (Signed)
Subjective:  Alyssa [name] is a 33 y.o. G3P2002 at [redacted]w[redacted]d being seen today for ongoing prenatal care.  She is currently monitored for the following issues for this high-risk pregnancy and has GDM, class A2; Supervision of high risk pregnancy, antepartum; Language barrier; and AMA (advanced maternal age) multigravida 35+ on their problem list.  GDM: Patient taking metformin 500mg  qhs.  Reports no hypoglycemic episodes.  Tolerating medication well Fasting: Controlled 2hr PP: mostly controlled. Occasional elevated CBG in the 120-130  Patient reports no complaints.  Contractions: Not present. Vag. Bleeding: None.  Movement: Present. Denies leaking of fluid.   The following portions of the patient's history were reviewed and updated as appropriate: allergies, current medications, past family history, past medical history, past social history, past surgical history and problem list. Problem list updated.  Objective:   Vitals:   10/28/19 0926  BP: 114/81  Pulse: (!) 101  Weight: 213 lb (96.6 kg)    Fetal Status: Fetal Heart Rate (bpm): 16   Movement: Present  Presentation: Vertex  Chaperone present  General:  Alert, oriented and cooperative. Patient is in no acute distress.  Skin: Skin is warm and dry. No rash noted.   Cardiovascular: Normal heart rate noted  Respiratory: Normal respiratory effort, no problems with respiration noted  Abdomen: Soft, gravid, appropriate for gestational age. Pain/Pressure: Absent     Pelvic: Vag. Bleeding: None     Cervical exam performed Dilation: 2 Effacement (%): 50 Station: -3  Extremities: Normal range of motion.  Edema: None  Mental Status: Normal mood and affect. Normal behavior. Normal judgment and thought content.   Urinalysis:      Assessment and Plan:  Pregnancy: G3P2002 at [redacted]w[redacted]d  1. Supervision of high risk pregnancy, antepartum FHT normal - Culture, beta strep (group b only) - GC/Chlamydia probe amp ([place])not at [facility]  2.  GDM, class A2 Controlled. Continue with BPP. Has growth tomorrow  3. Multigravida of advanced maternal age in third trimester  4. Language barrier Interpreter used.   Preterm labor symptoms and general obstetric precautions including but not limited to vaginal bleeding, contractions, leaking of fluid and fetal movement were reviewed in detail with the patient. Please refer to After Visit Summary for other counseling recommendations.  Return in about 1 week (around 11/04/2019) for HR OB f/u.   Truett Mainland, DO

## 2019-10-29 ENCOUNTER — Encounter (HOSPITAL_COMMUNITY): Payer: Self-pay

## 2019-10-29 ENCOUNTER — Ambulatory Visit (HOSPITAL_COMMUNITY)
Admission: RE | Admit: 2019-10-29 | Discharge: 2019-10-29 | Disposition: A | Discharge status: Home / Self Care | Payer: PRIVATE HEALTH INSURANCE | Source: Ambulatory Visit | Attending: Obstetrics and Gynecology | Admitting: Obstetrics and Gynecology

## 2019-10-29 ENCOUNTER — Ambulatory Visit (HOSPITAL_COMMUNITY): Payer: PRIVATE HEALTH INSURANCE | Admitting: *Deleted

## 2019-10-29 ENCOUNTER — Ambulatory Visit (HOSPITAL_COMMUNITY): Payer: Self-pay

## 2019-10-29 DIAGNOSIS — O09523 Supervision of elderly multigravida, third trimester: Secondary | ICD-10-CM

## 2019-10-29 DIAGNOSIS — O99213 Obesity complicating pregnancy, third trimester: Secondary | ICD-10-CM

## 2019-10-29 DIAGNOSIS — O0990 Supervision of high risk pregnancy, unspecified, unspecified trimester: Secondary | ICD-10-CM | POA: Insufficient documentation

## 2019-10-29 DIAGNOSIS — O09529 Supervision of elderly multigravida, unspecified trimester: Secondary | ICD-10-CM

## 2019-10-29 DIAGNOSIS — Z362 Encounter for other antenatal screening follow-up: Secondary | ICD-10-CM

## 2019-10-29 DIAGNOSIS — O24415 Gestational diabetes mellitus in pregnancy, controlled by oral hypoglycemic drugs: Secondary | ICD-10-CM | POA: Diagnosis not present

## 2019-10-29 DIAGNOSIS — Z3A36 36 weeks gestation of pregnancy: Secondary | ICD-10-CM

## 2019-10-29 LAB — GC/CHLAMYDIA PROBE AMP (~~LOC~~) NOT AT ARMC
Chlamydia: NEGATIVE
Comment: NEGATIVE
Comment: NORMAL
Neisseria Gonorrhea: NEGATIVE

## 2019-10-31 NOTE — L&D Delivery Note (Signed)
OB/GYN Faculty Practice Delivery Note  Alyssa [name] is a 34 y.o. [id number] s/p NSVD at [redacted]w[redacted]d. She was admitted for SOL.   ROM: 0h 47m with clear fluid GBS Status: negative Maximum Maternal Temperature: 98.5 F  Labor Progress: She was admitted, progressed naturally to complete and delivered shortly after SROM.  Delivery Date/Time: 11/12/2019,  Delivery: Called to room and patient was complete and pushing. Head delivered OA. Loose nuchal cord present, delivered through. Shoulder and body delivered in usual fashion. Infant with spontaneous cry, placed on mother's abdomen, dried and stimulated. Cord clamped x 2 after 1-minute delay, and cut by me. Cord blood drawn. Placenta delivered spontaneously with gentle cord traction. Fundus firm with massage and Pitocin. Labia, perineum, vagina, and cervix were inspected with the following lacerations: periurthral-hemostatic, two right-sided perineal abrasions- not hemostatic, repaired with 4-0 Vicryl in the usual fashion.   Placenta: intact, 3 vessel cord, sent to pathology for concern for chorioamnionitis Complications: None Lacerations: periurthral-hemostatic; two right-sided perineal abrasions- not hemostatic, repaired with 4-0 Vicryl in the usual fashion EBL: 267 mL Analgesia: 1% lidocaine for repair  Postpartum Planning [x]  message to sent to schedule follow-up  [x]  vaccines UTD  Infant: female  APGARs 9, 9  3245 g  , DO OB/GYN Fellow, Faculty Practice

## 2019-11-01 LAB — CULTURE, BETA STREP (GROUP B ONLY): Strep Gp B Culture: NEGATIVE

## 2019-11-06 ENCOUNTER — Other Ambulatory Visit: Payer: Self-pay

## 2019-11-06 ENCOUNTER — Ambulatory Visit (HOSPITAL_COMMUNITY): Payer: PRIVATE HEALTH INSURANCE | Admitting: *Deleted

## 2019-11-06 ENCOUNTER — Encounter (HOSPITAL_COMMUNITY): Payer: Self-pay

## 2019-11-06 ENCOUNTER — Ambulatory Visit (HOSPITAL_COMMUNITY)
Admission: RE | Admit: 2019-11-06 | Discharge: 2019-11-06 | Disposition: A | Discharge status: Home / Self Care | Payer: Self-pay | Source: Ambulatory Visit | Attending: Obstetrics and Gynecology | Admitting: Obstetrics and Gynecology

## 2019-11-06 DIAGNOSIS — O09523 Supervision of elderly multigravida, third trimester: Secondary | ICD-10-CM | POA: Insufficient documentation

## 2019-11-06 DIAGNOSIS — O0990 Supervision of high risk pregnancy, unspecified, unspecified trimester: Secondary | ICD-10-CM | POA: Insufficient documentation

## 2019-11-06 DIAGNOSIS — O99213 Obesity complicating pregnancy, third trimester: Secondary | ICD-10-CM

## 2019-11-06 DIAGNOSIS — Z3A37 37 weeks gestation of pregnancy: Secondary | ICD-10-CM

## 2019-11-06 DIAGNOSIS — O24415 Gestational diabetes mellitus in pregnancy, controlled by oral hypoglycemic drugs: Secondary | ICD-10-CM | POA: Insufficient documentation

## 2019-11-07 ENCOUNTER — Encounter: Payer: Self-pay | Admitting: Obstetrics and Gynecology

## 2019-11-07 ENCOUNTER — Ambulatory Visit (INDEPENDENT_AMBULATORY_CARE_PROVIDER_SITE_OTHER): Payer: PRIVATE HEALTH INSURANCE | Admitting: Obstetrics and Gynecology

## 2019-11-07 VITALS — BP 124/78 | HR 85 | Wt 215.6 lb

## 2019-11-07 DIAGNOSIS — Z3A37 37 weeks gestation of pregnancy: Secondary | ICD-10-CM

## 2019-11-07 DIAGNOSIS — O24419 Gestational diabetes mellitus in pregnancy, unspecified control: Secondary | ICD-10-CM

## 2019-11-07 DIAGNOSIS — O0990 Supervision of high risk pregnancy, unspecified, unspecified trimester: Secondary | ICD-10-CM

## 2019-11-07 DIAGNOSIS — Z789 Other specified health status: Secondary | ICD-10-CM

## 2019-11-07 DIAGNOSIS — O0993 Supervision of high risk pregnancy, unspecified, third trimester: Secondary | ICD-10-CM

## 2019-11-07 DIAGNOSIS — O09523 Supervision of elderly multigravida, third trimester: Secondary | ICD-10-CM

## 2019-11-07 NOTE — Patient Instructions (Signed)
Tercer trimestre de embarazo Third Trimester of Pregnancy  El tercer trimestre comprende desde la semana28 hasta la semana40 (desde el mes7 hasta el mes9). En este trimestre, el beb en gestacin (feto) crece muy rpidamente. Hacia el final del noveno mes, el beb en gestacin mide alrededor de 20pulgadas (45cm) de largo. Pesa entre 6y 10libras (2,70y 4,50kg). Siga estas indicaciones en su casa: Medicamentos  Tome los medicamentos de venta libre y los recetados solamente como se lo haya indicado el mdico. Algunos medicamentos son seguros para tomar durante el embarazo y otros no lo son.  Tome vitaminas prenatales que contengan por lo menos 600microgramos (?g) de cido flico.  Si tiene dificultad para mover el intestino (estreimiento), tome un medicamento para ablandar las heces (laxante) si su mdico se lo autoriza. Comida y bebida   Ingiera alimentos saludables de manera regular.  No coma carne cruda ni quesos sin cocinar.  Si obtiene poca cantidad de calcio de los alimentos que ingiere, consulte a su mdico sobre la posibilidad de tomar un suplemento diario de calcio.  La ingesta diaria de cuatro o cinco comidas pequeas en lugar de tres comidas abundantes.  Evite el consumo de alimentos ricos en grasas y azcares, como los alimentos fritos y los dulces.  Para evitar el estreimiento: ? Consuma alimentos ricos en fibra, como frutas y verduras frescas, cereales integrales y frijoles. ? Beba suficiente lquido para mantener el pis (orina) claro o de color amarillo plido. Actividad  Haga ejercicios solamente como se lo haya indicado el mdico. Interrumpa la actividad fsica si comienza a tener calambres.  No levante objetos pesados, use zapatos de tacones bajos y sintese derecha.  No haga ejercicio si hace demasiado calor, hay demasiada humedad o se encuentra en un lugar de mucha altura (altitud alta).  Puede continuar teniendo relaciones sexuales, a menos que el  mdico le indique lo contrario. Alivio del dolor y del malestar  Use un sostn que le brinde buen soporte si sus mamas estn sensibles.  Haga pausas frecuentes y descanse con las piernas levantadas si tiene calambres en las piernas o dolor en la zona lumbar.  Dese baos de asiento con agua tibia para aliviar el dolor o las molestias causadas por las hemorroides. Use una crema para las hemorroides si el mdico la autoriza.  Si desarrolla venas hinchadas y abultadas (vrices) en las piernas: ? Use medias de compresin o medias de descanso como se lo haya indicado el mdico. ? Levante (eleve) los pies durante 15minutos, 3 o 4veces por da. ? Limite el consumo de sal en sus alimentos. Seguridad  Colquese el cinturn de seguridad cuando conduzca.  Haga una lista de los nmeros de telfono de emergencia, que incluya los nmeros de telfono de familiares, amigos, el hospital, as como los departamentos de polica y bomberos. Preparacin para la llegada del beb Para prepararse para la llegada de su beb:  Tome clases prenatales.  Practique ir manejando al hospital.  Visite el hospital y recorra el rea de maternidad.  Hable en su trabajo acerca de tomar licencia cuando llegue el beb.  Prepare el bolso que llevar al hospital.  Prepare la habitacin del beb.  Concurra a los controles mdicos.  Compre un asiento de seguridad orientado hacia atrs para llevar al beb en el automvil. Aprenda cmo instalarlo en el auto. Instrucciones generales  No se d baos de inmersin en agua caliente, baos turcos ni saunas.  No consuma ningn producto que contenga nicotina o tabaco, como cigarrillos y cigarrillos   electrnicos. Si necesita ayuda para dejar de fumar, consulte al mdico.  No beba alcohol.  No se haga duchas vaginales ni use tampones o toallas higinicas perfumadas.  No mantenga las piernas cruzadas durante mucho tiempo.  No haga viajes de larga distancia, excepto si es  obligatorio. Hgalos solamente si su mdico la autoriza.  Visite a su dentista si no lo ha hecho durante el embarazo. Use un cepillo de cerdas suaves para cepillarse los dientes. Psese el hilo dental con suavidad.  Evite el contacto con las bandejas sanitarias de los gatos y la tierra que estos animales usan. Estos elementos contienen bacterias que pueden causar defectos congnitos al beb y la posible prdida del beb (aborto espontneo) o la muerte fetal.  Concurra a todas las visitas prenatales como se lo haya indicado el mdico. Esto es importante. Comunquese con un mdico si:  No est segura de si est en trabajo de parto o si ha roto la bolsa de las aguas.  Tiene mareos.  Tiene clicos leves o siente presin en la parte baja del vientre.  Sufre un dolor persistente en el abdomen.  Sigue teniendo malestar estomacal, vomita o tiene heces lquidas.  Advierte un lquido con olor ftido que proviene de la vagina.  Siente dolor al orinar. Solicite ayuda de inmediato si:  Tiene fiebre.  Tiene una prdida de lquido por la vagina.  Tiene sangrado o pequeas prdidas vaginales.  Siente dolor intenso o clicos en el abdomen.  Aumenta o baja de peso rpidamente.  Tiene dificultades para recuperar el aliento y siente dolor en el pecho.  Sbitamente se le hinchan mucho el rostro, las manos, los tobillos, los pies o las piernas.  No ha sentido los movimientos del beb durante una hora.  Siente un dolor de cabeza intenso que no se alivia con medicamentos.  Tiene dificultad para ver.  Tiene prdida de lquido o le sale un chorro de lquido de la vagina antes de estar en la semana 37.  Tiene espasmos abdominales (contracciones) regulares antes de estar en la semana 37. Resumen  El tercer trimestre comprende desde la semana28 hasta la semana40 (desde el mes7 hasta el mes9). Esta es la poca en que el beb en gestacin crece muy rpidamente.  Siga los consejos del mdico  con respecto a los medicamentos, la alimentacin y la actividad.  Preprese para la llegada del beb tomando las clases prenatales, preparando todo lo que necesitar el beb, arreglando la habitacin del beb y concurriendo a los controles mdicos.  Solicite ayuda de inmediato si tiene sangrado por la vagina, siente dolor en el pecho o tiene dificultad para respirar, o si no ha sentido que su beb se mueve en el transcurso de ms de una hora. Esta informacin no tiene como fin reemplazar el consejo del mdico. Asegrese de hacerle al mdico cualquier pregunta que tenga. Document Revised: 05/21/2017 Document Reviewed: 05/21/2017 Elsevier Patient Education  2020 Elsevier Inc.  

## 2019-11-07 NOTE — Progress Notes (Signed)
Pt has Paper Log for Glucose readings.

## 2019-11-07 NOTE — Progress Notes (Signed)
Subjective:  Alyssa [name] is a 34 y.o. G3P2002 at [redacted]w[redacted]d being seen today for ongoing prenatal care.  She is currently monitored for the following issues for this high-risk pregnancy and has GDM, class A2; Supervision of high risk pregnancy, antepartum; Language barrier; and AMA (advanced maternal age) multigravida 35+ on their problem list.  Patient reports general discomforts of pregnancy.  Contractions: Not present. Vag. Bleeding: None.  Movement: Present. Denies leaking of fluid.   The following portions of the patient's history were reviewed and updated as appropriate: allergies, current medications, past family history, past medical history, past social history, past surgical history and problem list. Problem list updated.  Objective:   Vitals:   11/07/19 1020  BP: 124/78  Pulse: 85  Weight: 215 lb 9.6 oz (97.8 kg)    Fetal Status: Fetal Heart Rate (bpm): 139   Movement: Present     General:  Alert, oriented and cooperative. Patient is in no acute distress.  Skin: Skin is warm and dry. No rash noted.   Cardiovascular: Normal heart rate noted  Respiratory: Normal respiratory effort, no problems with respiration noted  Abdomen: Soft, gravid, appropriate for gestational age. Pain/Pressure: Absent     Pelvic:  Cervical exam deferred        Extremities: Normal range of motion.  Edema: None  Mental Status: Normal mood and affect. Normal behavior. Normal judgment and thought content.   Urinalysis:      Assessment and Plan:  Pregnancy: G3P2002 at [redacted]w[redacted]d  1. Supervision of high risk pregnancy, antepartum Stable Labor precautions IOL scheduled 39-39 6/7 weeks   2. GDM, class A2 CBG's in goal range Continue with qhs metformin and weekly BPP  3. Multigravida of advanced maternal age in third trimester Stable  4. Language barrier Video interrupter used today  Term labor symptoms and general obstetric precautions including but not limited to vaginal bleeding,  contractions, leaking of fluid and fetal movement were reviewed in detail with the patient. Please refer to After Visit Summary for other counseling recommendations.  Return in about 1 week (around 11/14/2019) for OB visit, face to face, MD provider.   Hermina Staggers, MD

## 2019-11-10 ENCOUNTER — Telehealth (HOSPITAL_COMMUNITY): Payer: Self-pay | Admitting: *Deleted

## 2019-11-10 NOTE — Telephone Encounter (Signed)
Preadmission screen Interpreter number [phone]

## 2019-11-11 ENCOUNTER — Encounter (HOSPITAL_COMMUNITY): Payer: Self-pay | Admitting: *Deleted

## 2019-11-11 ENCOUNTER — Telehealth (HOSPITAL_COMMUNITY): Payer: Self-pay | Admitting: *Deleted

## 2019-11-11 NOTE — Telephone Encounter (Signed)
Preadmission screen Interpreter number [phone]

## 2019-11-12 ENCOUNTER — Encounter (HOSPITAL_COMMUNITY): Payer: Self-pay | Admitting: Obstetrics and Gynecology

## 2019-11-12 ENCOUNTER — Inpatient Hospital Stay (HOSPITAL_COMMUNITY)
Admission: AD | Admit: 2019-11-12 | Discharge: 2019-11-13 | DRG: 807 | Disposition: A | Discharge status: Home / Self Care | Payer: Medicaid Other | Attending: Obstetrics & Gynecology | Admitting: Obstetrics & Gynecology

## 2019-11-12 ENCOUNTER — Inpatient Hospital Stay (EMERGENCY_DEPARTMENT_HOSPITAL)
Admission: AD | Admit: 2019-11-12 | Discharge: 2019-11-12 | Disposition: A | Discharge status: Home / Self Care | Payer: Medicaid Other | Source: Home / Self Care | Attending: Obstetrics and Gynecology | Admitting: Obstetrics and Gynecology

## 2019-11-12 ENCOUNTER — Other Ambulatory Visit: Payer: Self-pay

## 2019-11-12 DIAGNOSIS — Z20822 Contact with and (suspected) exposure to covid-19: Secondary | ICD-10-CM | POA: Diagnosis present

## 2019-11-12 DIAGNOSIS — Z789 Other specified health status: Secondary | ICD-10-CM | POA: Diagnosis present

## 2019-11-12 DIAGNOSIS — O24420 Gestational diabetes mellitus in childbirth, diet controlled: Secondary | ICD-10-CM

## 2019-11-12 DIAGNOSIS — O26893 Other specified pregnancy related conditions, third trimester: Secondary | ICD-10-CM | POA: Insufficient documentation

## 2019-11-12 DIAGNOSIS — O24425 Gestational diabetes mellitus in childbirth, controlled by oral hypoglycemic drugs: Principal | ICD-10-CM | POA: Diagnosis present

## 2019-11-12 DIAGNOSIS — O24419 Gestational diabetes mellitus in pregnancy, unspecified control: Secondary | ICD-10-CM

## 2019-11-12 DIAGNOSIS — Z758 Other problems related to medical facilities and other health care: Secondary | ICD-10-CM | POA: Diagnosis present

## 2019-11-12 DIAGNOSIS — Z0371 Encounter for suspected problem with amniotic cavity and membrane ruled out: Secondary | ICD-10-CM

## 2019-11-12 DIAGNOSIS — O09523 Supervision of elderly multigravida, third trimester: Secondary | ICD-10-CM | POA: Insufficient documentation

## 2019-11-12 DIAGNOSIS — Z3A38 38 weeks gestation of pregnancy: Secondary | ICD-10-CM

## 2019-11-12 DIAGNOSIS — Z3689 Encounter for other specified antenatal screening: Secondary | ICD-10-CM

## 2019-11-12 DIAGNOSIS — O0990 Supervision of high risk pregnancy, unspecified, unspecified trimester: Secondary | ICD-10-CM

## 2019-11-12 DIAGNOSIS — O09529 Supervision of elderly multigravida, unspecified trimester: Secondary | ICD-10-CM

## 2019-11-12 LAB — CBC
HCT: 37.1 % (ref 36.0–46.0)
Hemoglobin: 12.4 g/dL (ref 12.0–15.0)
MCH: 31.2 pg (ref 26.0–34.0)
MCHC: 33.4 g/dL (ref 30.0–36.0)
MCV: 93.5 fL (ref 80.0–100.0)
Platelets: 196 10*3/uL (ref 150–400)
RBC: 3.97 MIL/uL (ref 3.87–5.11)
RDW: 12.7 % (ref 11.5–15.5)
WBC: 8.2 10*3/uL (ref 4.0–10.5)
nRBC: 0 % (ref 0.0–0.2)

## 2019-11-12 LAB — RESPIRATORY PANEL BY RT PCR (FLU A&B, COVID)
Influenza A by PCR: NEGATIVE
Influenza B by PCR: NEGATIVE
SARS Coronavirus 2 by RT PCR: NEGATIVE

## 2019-11-12 LAB — TYPE AND SCREEN
ABO/RH(D): A POS
Antibody Screen: NEGATIVE

## 2019-11-12 LAB — ABO/RH: ABO/RH(D): A POS

## 2019-11-12 LAB — GLUCOSE, CAPILLARY: Glucose-Capillary: 82 mg/dL (ref 70–99)

## 2019-11-12 MED ORDER — BENZOCAINE-MENTHOL 20-0.5 % EX AERO: 1.0000 "application " | INHALATION_SPRAY | CUTANEOUS | Status: DC | PRN

## 2019-11-12 MED ORDER — FENTANYL CITRATE (PF) 100 MCG/2ML IJ SOLN
50.0000 ug | INTRAMUSCULAR | Status: DC | PRN
  Administered 2019-11-12: 100 ug via INTRAVENOUS
  Filled 2019-11-12: qty 2

## 2019-11-12 MED ORDER — ZOLPIDEM TARTRATE 5 MG PO TABS: 5.0000 mg | ORAL_TABLET | Freq: Every evening | ORAL | Status: DC | PRN

## 2019-11-12 MED ORDER — COCONUT OIL OIL: 1.0000 "application " | TOPICAL_OIL | Status: DC | PRN

## 2019-11-12 MED ORDER — LIDOCAINE HCL (PF) 1 % IJ SOLN
30.0000 mL | INTRAMUSCULAR | Status: AC | PRN
  Administered 2019-11-12: 30 mL via SUBCUTANEOUS
  Filled 2019-11-12: qty 30

## 2019-11-12 MED ORDER — WITCH HAZEL-GLYCERIN EX PADS: 1.0000 "application " | MEDICATED_PAD | CUTANEOUS | Status: DC | PRN

## 2019-11-12 MED ORDER — ONDANSETRON HCL 4 MG/2ML IJ SOLN: 4.0000 mg | INTRAMUSCULAR | Status: DC | PRN

## 2019-11-12 MED ORDER — ONDANSETRON HCL 4 MG/2ML IJ SOLN: 4.0000 mg | Freq: Four times a day (QID) | INTRAMUSCULAR | Status: DC | PRN

## 2019-11-12 MED ORDER — PRENATAL MULTIVITAMIN CH
1.0000 | ORAL_TABLET | Freq: Every day | ORAL | Status: DC
  Administered 2019-11-13: 1 via ORAL
  Filled 2019-11-12: qty 1

## 2019-11-12 MED ORDER — SIMETHICONE 80 MG PO CHEW: 80.0000 mg | CHEWABLE_TABLET | ORAL | Status: DC | PRN

## 2019-11-12 MED ORDER — LACTATED RINGERS IV SOLN: 500.0000 mL | INTRAVENOUS | Status: DC | PRN

## 2019-11-12 MED ORDER — OXYTOCIN 40 UNITS IN NORMAL SALINE INFUSION - SIMPLE MED
2.5000 [IU]/h | INTRAVENOUS | Status: DC
  Filled 2019-11-12: qty 1000

## 2019-11-12 MED ORDER — DIPHENHYDRAMINE HCL 25 MG PO CAPS: 25.0000 mg | ORAL_CAPSULE | Freq: Four times a day (QID) | ORAL | Status: DC | PRN

## 2019-11-12 MED ORDER — TETANUS-DIPHTH-ACELL PERTUSSIS 5-2.5-18.5 LF-MCG/0.5 IM SUSP: 0.5000 mL | Freq: Once | INTRAMUSCULAR | Status: DC

## 2019-11-12 MED ORDER — SOD CITRATE-CITRIC ACID 500-334 MG/5ML PO SOLN: 30.0000 mL | ORAL | Status: DC | PRN

## 2019-11-12 MED ORDER — DIBUCAINE (PERIANAL) 1 % EX OINT: 1.0000 "application " | TOPICAL_OINTMENT | CUTANEOUS | Status: DC | PRN

## 2019-11-12 MED ORDER — IBUPROFEN 600 MG PO TABS
600.0000 mg | ORAL_TABLET | Freq: Four times a day (QID) | ORAL | Status: DC
  Administered 2019-11-13 (×4): 600 mg via ORAL
  Filled 2019-11-12 (×4): qty 1

## 2019-11-12 MED ORDER — LACTATED RINGERS IV SOLN: INTRAVENOUS | Status: DC

## 2019-11-12 MED ORDER — ACETAMINOPHEN 325 MG PO TABS: 650.0000 mg | ORAL_TABLET | ORAL | Status: DC | PRN

## 2019-11-12 MED ORDER — OXYCODONE HCL 5 MG PO TABS: 10.0000 mg | ORAL_TABLET | ORAL | Status: DC | PRN

## 2019-11-12 MED ORDER — SENNOSIDES-DOCUSATE SODIUM 8.6-50 MG PO TABS
2.0000 | ORAL_TABLET | ORAL | Status: DC
  Administered 2019-11-13: 2 via ORAL
  Filled 2019-11-12: qty 2

## 2019-11-12 MED ORDER — OXYCODONE HCL 5 MG PO TABS: 5.0000 mg | ORAL_TABLET | ORAL | Status: DC | PRN

## 2019-11-12 MED ORDER — OXYTOCIN BOLUS FROM INFUSION
500.0000 mL | Freq: Once | INTRAVENOUS | Status: AC
  Administered 2019-11-12: 500 mL via INTRAVENOUS

## 2019-11-12 MED ORDER — ONDANSETRON HCL 4 MG PO TABS: 4.0000 mg | ORAL_TABLET | ORAL | Status: DC | PRN

## 2019-11-12 NOTE — Progress Notes (Signed)
Pt asking for IV pain medications  FHR: 145 bmp, mod var, accels, no decels- Cat I  100 mcg IV Fentanyl ordered  Anticipate NSVD  Maggy Wyble, Margarette Asal, DO OB Fellow, Faculty Practice 11/12/2019 5:55 PM

## 2019-11-12 NOTE — Progress Notes (Signed)
Dr Salomon Mast notified of pts CTX, FHR and VE. Orders received to admit pt

## 2019-11-12 NOTE — Progress Notes (Signed)
Called and spoke with Philipp Deputy re; pt's Heb B Status.  Will order stat if not available.

## 2019-11-12 NOTE — MAU Note (Signed)
Pt presents to MAU with c/o PROM. She felt wetness in her under clothes when she woke up at 4 am and noticed slight leaking since then. She also has noticed small amount of bleeding with leaking and has had mild ctx intermittently since leaking. +FM

## 2019-11-12 NOTE — Discharge Summary (Signed)
Postpartum Discharge Summary     Patient Name: Alyssa [name] DOB: 1985-12-19 MRN: [id number]  Date of admission: 11/12/2019 Delivering Provider: Merilyn Baba   Date of discharge: 11/13/2019  Admitting diagnosis: Normal labor [O80, Z37.9] Intrauterine pregnancy: [redacted]w[redacted]d    Secondary diagnosis:  Active Problems:   GDM, class A2   Language barrier   AMA (advanced maternal age) multigravida 35+   Normal labor  Additional problems: none     Discharge diagnosis: Term Pregnancy Delivered and GDM A2                                                                                                Post partum procedures:none  Augmentation: None  Complications: None  Hospital course:  Onset of Labor With Vaginal Delivery     34y.o. yo [id number] 361w4das admitted in Active Labor on 11/12/2019. Patient had an uncomplicated labor course as follows:   She was admitted, progressed naturally to complete and delivered shortly after SROM.  Membrane Rupture Time/Date: 6:19 PM ,11/12/2019   Intrapartum Procedures: Episiotomy: None [1]                                         Lacerations:  Periurethral [8];Perineal [11]  Patient had a delivery of a Viable infant. 11/12/2019  Information for the patient's newborn:  [name], [place]0[[id number]]Delivery Method: Vaginal, Spontaneous(Filed from Delivery Summary)     Pateint had an uncomplicated postpartum course.  She is ambulating, tolerating a regular diet, passing flatus, and urinating well. Patient is discharged home in stable condition on 11/13/19, per her request for early discharge.  Delivery time: 6:20 PM    Magnesium Sulfate received: No BMZ received: No Rhophylac:N/A MMR:N/A Transfusion:No  Physical exam  Vitals:   11/12/19 1956 11/12/19 2118 11/13/19 0111 11/13/19 0603  BP: 124/67 112/72 110/70 110/62  Pulse: 71 75 68 73  Resp: '18 18 18 18  ' Temp: 99.1 F (37.3 C) 99.1 F (37.3 C) 98.1 F (36.7  C) 98 F (36.7 C)  TempSrc: Oral Oral Oral Oral  SpO2: 100% 98% 98% 98%  Weight:      Height:       General: alert and cooperative Lochia: appropriate Uterine Fundus: firm Incision: N/A DVT Evaluation: No evidence of DVT seen on physical exam. Labs: Lab Results  Component Value Date   WBC 8.2 11/12/2019   HGB 12.4 11/12/2019   HCT 37.1 11/12/2019   MCV 93.5 11/12/2019   PLT 196 11/12/2019   CMP Latest Ref Rng & Units 06/06/2019  Glucose 65 - 99 mg/dL 91  BUN 6 - 20 mg/dL 7  Creatinine 0.57 - 1.00 mg/dL 0.40(L)  Sodium 134 - 144 mmol/L 138  Potassium 3.5 - 5.2 mmol/L 4.1  Chloride 96 - 106 mmol/L 102  CO2 20 - 29 mmol/L 22  Calcium 8.7 - 10.2 mg/dL 9.8  Total Protein 6.0 - 8.5 g/dL 6.6  Total Bilirubin 0.0 - 1.2 mg/dL <0.2  Alkaline Phos 39 - 117 IU/L 54  AST 0 - 40 IU/L 15  ALT 0 - 32 IU/L 17    Discharge instruction: per After Visit Summary and "Baby and Me Booklet".  After visit meds:  Allergies as of 11/13/2019   No Known Allergies     Medication List    STOP taking these medications   aspirin 81 MG chewable tablet   metFORMIN 500 MG tablet Commonly known as: Glucophage     TAKE these medications   ibuprofen 600 MG tablet Commonly known as: ADVIL Take 1 tablet (600 mg total) by mouth every 6 (six) hours as needed.   Prenatal Vitamin 27-0.8 MG Tabs Take 1 tablet by mouth daily.       Diet: routine diet  Activity: Advance as tolerated. Pelvic rest for 6 weeks.   Outpatient follow up:4 weeks with GTT Follow up Appt: Future Appointments  Date Time Provider [place]  11/13/2019  1:45 PM [place] [facility]  11/13/2019  1:45 PM [place] 2 [facility]-MFC[facility]-US  11/19/2019  2:40 PM [facility]-SCREENING [facility] None  11/19/2019  3:35 PM Aletha Halim, MD [place]   Follow up Visit: [place] [facility] .   Specialty: Obstetrics and Gynecology Contact information: [address]  [place], [place] [id number] [place] [address] [phone]         Please schedule this patient for Postpartum visit in: 4 weeks with the following provider: Any provider For C/S patients schedule nurse incision check in weeks 2 weeks: no High risk pregnancy complicated by: [id number] Delivery mode:  SVD Anticipated Birth Control:  other/unsure PP Procedures needed: 2 hour GTT, not virtual Schedule Integrated BH visit: no   Newborn Data: Live born female  Birth Weight: 7 lb 2.5 oz (3245 g) APGAR: 9, 9  Newborn Delivery   Birth date/time: 11/12/2019 18:20:00 Delivery type: Vaginal, Spontaneous      Baby Feeding: Bottle and Breast Disposition:home with mother   11/13/2019 Myrtis Ser, CNM  8:42 AM

## 2019-11-12 NOTE — Discharge Instructions (Signed)

## 2019-11-12 NOTE — Discharge Instructions (Signed)
Parto vaginal, cuidados de puerperio Postpartum Care After Vaginal Delivery Lea esta informacin sobre cmo cuidarse desde el momento en que nazca su beb y hasta 6 a 12 semanas despus del parto (perodo del posparto). El mdico tambin podr darle instrucciones ms especficas. Comunquese con su mdico si tiene problemas o preguntas. Siga estas indicaciones en su casa: Hemorragia vaginal  Es normal tener un poco de hemorragia vaginal (loquios) despus del parto. Use un apsito sanitario para el sangrado vaginal y secrecin. ? Durante la primera semana despus del parto, la cantidad y el aspecto de los loquios a menudo es similar a las del perodo menstrual. ? Durante las siguientes semanas disminuir gradualmente hasta convertirse en una secrecin seca amarronada o amarillenta. ? En la mayora de las mujeres, los loquios se detienen completamente entre 4 a 6semanas despus del parto. Los sangrados vaginales pueden variar de mujer a mujer.  Cambie los apsitos sanitarios con frecuencia. Observe si hay cambios en el flujo, como: ? Un aumento repentino en el volumen. ? Cambio en el color. ? Cogulos sanguneos grandes.  Si expulsa un cogulo de sangre por la vagina, gurdelo y llame al mdico para informrselo. No deseche los cogulos de sangre por el inodoro antes de hablar con su mdico.  No use tampones ni se haga duchas vaginales hasta que el mdico la autorice.  Si no est amamantando, volver a tener su perodo entre 6 y 8 semanas despus del parto. Si solamente alimenta al beb con leche materna (lactancia materna exclusiva), podra no volver a tener su perodo hasta que deje de amamantar. Cuidados perineales  Mantenga la zona entre la vagina y el ano (perineo) limpia y seca, como se lo haya indicado el mdico. Utilice apsitos o aerosoles analgsicos y cremas, como se lo hayan indicado.  Si le hicieron un corte en el perineo (episiotoma) o tuvo un desgarro en la vagina, controle la  zona para detectar signos de infeccin hasta que sane. Est atenta a los siguientes signos: ? Aumento del enrojecimiento, la hinchazn o el dolor. ? Presenta lquido o sangre que supura del corte o desgarro. ? Calor. ? Pus o mal olor.  Es posible que le den una botella rociadora para que use en lugar de limpiarse el rea con papel higinico despus de usar el bao. Cuando comience a sanar, podr usar la botella rociadora antes de secarse. Asegrese de secarse suavemente.  Para aliviar el dolor causado por una episiotoma, un desgarro en la vagina o venas hinchadas en el ano (hemorroides), trate de tomar un bao de asiento tibio 2 o 3 veces por da. Un bao de asiento es un bao de agua tibia que se toma mientras se est sentado. El agua solo debe llegar hasta las caderas y cubrir las nalgas. Cuidado de las mamas  En los primeros das despus del parto, las mamas pueden sentirse pesadas, llenas e incmodas (congestin mamaria). Tambin puede escaparse leche de sus senos. El mdico puede sugerirle mtodos para aliviar este malestar. La congestin mamaria debera desaparecer al cabo de unos das.  Si est amamantando: ? Use un sostn que sujete y ajuste bien sus pechos. ? Mantenga los pezones secos y limpios. Aplquese cremas y ungentos, como se lo haya indicado el mdico. ? Es posible que deba usar discos de algodn en el sostn para absorber la leche que se filtre de sus senos. ? Puede tener contracciones uterinas cada vez que amamante durante varias semanas despus del parto. Las contracciones uterinas ayudan al tero a   regresar a su tamao habitual. ? Si tiene algn problema con la lactancia materna, colabore con el mdico o un asesor en lactancia.  Si no est amamantando: ? Evite tocarse mucho las mamas. Al hacerlo, podran producir ms leche. ? Use un sostn que le proporcione el ajuste correcto y compresas fras para reducir la hinchazn. ? No extraiga (saque) leche materna. Esto har que  produzca ms leche. Intimidad y sexualidad  Pregntele al mdico cundo puede retomar la actividad sexual. Esto puede depender de lo siguiente: ? Su riesgo de sufrir infecciones. ? La rapidez con la que est sanando. ? Su comodidad y deseo de retomar la actividad sexual.  Despus del parto, puede quedar embarazada incluso si no ha tenido todava su perodo. Si lo desea, hable con el mdico acerca de los mtodos de control de la natalidad (mtodos anticonceptivos). Medicamentos  Tome los medicamentos de venta libre y los recetados solamente como se lo haya indicado el mdico.  Si le recetaron un antibitico, tmelo como se lo haya indicado el mdico. No deje de tomar el antibitico aunque comience a sentirse mejor. Actividad  Retome sus actividades normales de a poco como se lo haya indicado el mdico. Pregntele al mdico qu actividades son seguras para usted.  Descanse todo lo que pueda. Trate de descansar o tomar una siesta mientras el beb duerme. Comida y bebida   Beba suficiente lquido como para mantener la orina de color amarillo plido.  Coma alimentos ricos en fibras todos los das. Estos pueden ayudarla a prevenir o aliviar el estreimiento. Los alimentos ricos en fibras incluyen, entre otros: ? Panes y cereales integrales. ? Arroz integral. ? Frijoles. ? Frutas y verduras frescas.  No intente perder de peso rpidamente reduciendo el consumo de caloras.  Tome sus vitaminas prenatales hasta la visita de seguimiento de posparto o hasta que su mdico le indique que puede dejar de tomarlas. Estilo de vida  No consuma ningn producto que contenga nicotina o tabaco, como cigarrillos y cigarrillos electrnicos. Si necesita ayuda para dejar de fumar, consulte al mdico.  No beba alcohol, especialmente si est amamantando. Instrucciones generales  Concurra a todas las visitas de seguimiento para usted y el beb, como se lo haya indicado el mdico. La mayora de las mujeres  visita al mdico para un seguimiento de posparto dentro de las primeras 3 a 6 semanas despus del parto. Comunquese con un mdico si:  Se siente incapaz de controlar los cambios que implica tener un hijo y esos sentimientos no desaparecen.  Siente tristeza o preocupacin de forma inusual.  Las mamas se ponen rojas, le duelen o se endurecen.  Tiene fiebre.  Tiene dificultad para retener la orina o para impedir que la orina se escape.  Tiene poco inters o falta de inters en actividades que solan gustarle.  No ha amamantado nada y no ha tenido un perodo menstrual durante 12 semanas despus del parto.  Dej de amamantar al beb y no ha tenido su perodo menstrual durante 12 semanas despus de dejar de amamantar.  Tiene preguntas sobre su cuidado y el del beb.  Elimina un cogulo de sangre grande por la vagina. Solicite ayuda de inmediato si:  Siente dolor en el pecho.  Tiene dificultad para respirar.  Tiene un dolor repentino e intenso en la pierna.  Tiene dolor intenso o clicos en el la parte inferior del abdomen.  Tiene una hemorragia tan intensa de la vagina que empapa ms de un apsito en una hora. El sangrado   no debe ser ms abundante que el perodo ms intenso que haya tenido.  Dolor de cabeza intenso.  Se desmaya.  Tiene visin borrosa o manchas en la vista.  Tiene secrecin vaginal con mal olor.  Tiene pensamientos acerca de lastimarse a usted misma o a su beb. Si alguna vez siente que puede lastimarse a usted misma o a otras personas, o tiene pensamientos de poner fin a su vida, busque ayuda de inmediato. Puede dirigirse al departamento de emergencias ms cercano o llamar a:  El servicio de emergencias de su localidad (911 en [place]  Una lnea de asistencia al suicida y atencin en crisis, como la Lnea Nacional de Prevencin del Suicidio (National Suicide Prevention Lifeline), al 1-800-273-8255. Est disponible las 24 horas del da. Resumen  El  perodo de tiempo justo despus el parto y hasta 6 a 12 semanas despus del parto se denomina perodo posparto.  Retome sus actividades normales de a poco como se lo haya indicado el mdico.  Concurra a todas las visitas de seguimiento para usted y el beb, como se lo haya indicado el mdico. Esta informacin no tiene como fin reemplazar el consejo del mdico. Asegrese de hacerle al mdico cualquier pregunta que tenga. Document Revised: 01/26/2018 Document Reviewed: 10/07/2017 Elsevier Patient Education  2020 Elsevier Inc.  

## 2019-11-12 NOTE — MAU Note (Signed)
PT presents to MAU with complaints of contractions that have gotten worse throughout the day.

## 2019-11-12 NOTE — H&P (Signed)
OBSTETRIC ADMISSION HISTORY AND PHYSICAL  Alyssa [name] is a 34 y.o. female G3P2002 with IUP at [redacted]w[redacted]d presenting for SOL. She reports +FMs. No LOF, VB, blurry vision, headaches, peripheral edema, or RUQ pain. She plans on breast and bottle feeding. She unsure of what she wants for birth control.  Dating: By LMP c/w 14 week [place] --->  Estimated Date of Delivery: 11/22/19  Sono:   @[redacted]w[redacted]d , normal anatomy, cephalic presentation, 3018g, , EFW 6#10  Prenatal History/Complications: A2GDM (metformin)  Past Medical History: Past Medical History:  Diagnosis Date  . Gestational diabetes 05/29/2011  . Obese     Past Surgical History: No past surgical history on file.  Obstetrical History: OB History    Gravida  3   Para  2   Term  2   Preterm  0   AB  0   Living  2     SAB  0   TAB  0   Ectopic  0   Multiple  0   Live Births  2           Social History: Social History   Socioeconomic History  . Marital status: Married    Spouse name: Not on file  . Number of children: Not on file  . Years of education: Not on file  . Highest education level: Not on file  Occupational History  . Not on file  Tobacco Use  . Smoking status: Never Smoker  . Smokeless tobacco: Never Used  Substance and Sexual Activity  . Alcohol use: No  . Drug use: No  . Sexual activity: Yes    Birth control/protection: None  Other Topics Concern  . Not on file  Social History Narrative  . Not on file   Social Determinants of Health   Financial Resource Strain:   . Difficulty of Paying Living Expenses: Not on file  Food Insecurity:   . Worried About 05/31/2011 in the Last Year: Not on file  . Ran Out of Food in the Last Year: Not on file  Transportation Needs:   . Lack of Transportation (Medical): Not on file  . Lack of Transportation (Non-Medical): Not on file  Physical Activity:   . Days of Exercise per Week: Not on file  . Minutes of Exercise per Session:  Not on file  Stress:   . Feeling of Stress : Not on file  Social Connections:   . Frequency of Communication with Friends and Family: Not on file  . Frequency of Social Gatherings with Friends and Family: Not on file  . Attends Religious Services: Not on file  . Active Member of Clubs or Organizations: Not on file  . Attends Programme researcher, broadcasting/film/video Meetings: Not on file  . Marital Status: Not on file    Family History: Family History  Problem Relation Age of Onset  . Diabetes Mother     Allergies: No Known Allergies  Medications Prior to Admission  Medication Sig Dispense Refill Last Dose  . aspirin 81 MG chewable tablet Chew 1 tablet (81 mg total) by mouth daily. 90 tablet 3   . metFORMIN (GLUCOPHAGE) 500 MG tablet Take 1 tablet (500 mg total) by mouth at bedtime. With bedtime snack 30 tablet 3   . Prenatal Vit-Fe Fumarate-FA (PRENATAL VITAMIN) 27-0.8 MG TABS Take 1 tablet by mouth daily. 90 tablet 3      Review of Systems:  All systems reviewed and negative except as stated in HPI  PE: Last menstrual period 02/15/2019, currently breastfeeding. General appearance: alert and cooperative Lungs: regular rate and effort Heart: regular rate  Abdomen: soft, non-tender Extremities: Homans sign is negative, no sign of DVT Presentation: cephalic EFM: 366 bpm, moderate variability, 15x15 accels, no decels Toco: CTX q2-44min Dilation: (P) 5.5 Effacement (%): (P) 80  Prenatal labs: ABO, Rh: A/Positive/-- (07/14 0000) Antibody: Negative (07/14 0000) Rubella:   RPR: Non Reactive (11/04 1618)  HBsAg:    HIV: Non Reactive (11/04 1618)  GBS: Negative/-- (12/29 1153)  2 hr GTT early failed  Prenatal Transfer Tool  Maternal Diabetes: Yes:  Diabetes Type:  Insulin/Medication controlled Genetic Screening: Declined Maternal Ultrasounds/Referrals: Normal Fetal Ultrasounds or other Referrals:  Referred to Materal Fetal Medicine  Maternal Substance Abuse:  No Significant Maternal  Medications:  None Significant Maternal Lab Results: Group B Strep negative  No results found for this or any previous visit (from the past 24 hour(s)).  Patient Active Problem List   Diagnosis Date Noted  . Supervision of high risk pregnancy, antepartum 06/06/2019  . Language barrier 06/06/2019  . AMA (advanced maternal age) multigravida 35+ 06/06/2019  . GDM, class A2 09/25/2011    Assessment: Alyssa [name] is a 34 y.o. G3P2002 at [redacted]w[redacted]d here for SOL  1. Labor: expectant management 2. FWB: Cat I, EFW 7#8 by Leopold's 3. Pain: per patient request 4. GBS: negative, no prophylaxis necessary   Plan: Admit to L&D, anticipate NSVD.  [place], DO  11/12/2019, 4:25 PM

## 2019-11-12 NOTE — MAU Provider Note (Signed)
First Provider Initiated Contact with Patient 11/12/19 1109      S: Ms. Alyssa [name] is a 34 y.o. G3P2002 at [redacted]w[redacted]d  who presents to MAU today complaining of leaking of fluid since 0400AM this morning. Pt reports she woke up and her underwear were wet with a thick, yellow, mucus-like fluid with specs of blood. She denies vaginal bleeding. She denies contractions. She reports normal fetal movement.    O: BP 127/69 (BP Location: Right Arm)   Pulse 83   Temp 98.5 F (36.9 C) (Oral)   Resp 18   LMP 02/15/2019 (Exact Date)   SpO2 98%  GENERAL: Well-developed, well-nourished female in no acute distress.  HEAD: Normocephalic, atraumatic.  CHEST: Normal effort of breathing, regular heart rate ABDOMEN: Soft, nontender, gravid PELVIC: Normal external female genitalia. Vagina is pink and rugated. Cervix with normal contour, no lesions. Normal discharge.  No pooling. Mucus plug visualized on speculum exam.  Fern negative x2.  Cervical exam:  Dilation: 4 Effacement (%): 80 Cervical Position: Middle Presentation: (unable to determine) Exam by:: B. Bowen, RN  Presentation VTX Bulging bag palpable on exam.  Fetal Monitoring: reactive Baseline: 135 Variability: moderate Accelerations: present, 15x15 Decelerations: absent Contractions: few, irregular  No results found for this or any previous visit (from the past 24 hour(s)).  A: SIUP at [redacted]w[redacted]d  Membranes intact  P: -discharge to home  -strict labor/ROM/bleeding/DFM/return MAU precautions given  Asaad Gulley, Odie Sera, NP 11/12/2019 11:22 AM

## 2019-11-12 NOTE — Plan of Care (Signed)
Kaiyan Luczak, RN 

## 2019-11-13 ENCOUNTER — Ambulatory Visit (HOSPITAL_COMMUNITY): Payer: PRIVATE HEALTH INSURANCE | Attending: Obstetrics and Gynecology

## 2019-11-13 ENCOUNTER — Ambulatory Visit (HOSPITAL_COMMUNITY): Admission: RE | Admit: 2019-11-13 | Payer: PRIVATE HEALTH INSURANCE | Source: Ambulatory Visit

## 2019-11-13 ENCOUNTER — Encounter (HOSPITAL_COMMUNITY): Payer: Self-pay | Admitting: Obstetrics and Gynecology

## 2019-11-13 LAB — GLUCOSE, CAPILLARY: Glucose-Capillary: 75 mg/dL (ref 70–99)

## 2019-11-13 LAB — RPR: RPR Ser Ql: NONREACTIVE

## 2019-11-13 MED ORDER — IBUPROFEN 600 MG PO TABS: 600.0000 mg | ORAL_TABLET | Freq: Four times a day (QID) | ORAL | 1 refills | Status: AC | PRN

## 2019-11-13 NOTE — Progress Notes (Signed)
Check on pt needs, ordered meals by Orlan Leavens Spanish  Interpreter.

## 2019-11-13 NOTE — Progress Notes (Signed)
Patient was told to call to get up to the bathroom first time. Patient did not call and went to the bathroom by herself. Patient has steady gate.

## 2019-11-13 NOTE — Progress Notes (Signed)
Post Partum Day 1 NSVD Subjective: no complaints, up ad lib, voiding, tolerating PO and no BM yet  Objective: Blood pressure 110/62, pulse 73, temperature 98 F (36.7 C), temperature source Oral, resp. rate 18, height 5\' 5"  (1.651 m), weight 97.8 kg, last menstrual period 02/15/2019, SpO2 98 %, unknown if currently breastfeeding.  Physical Exam:  General: alert, cooperative and well appearing Lochia: appropriate Uterine Fundus: firm DVT Evaluation: No evidence of DVT seen on physical exam. No cords or calf tenderness. No significant calf/ankle edema.  Recent Labs    11/12/19 1638  HGB 12.4  HCT 37.1    Assessment/Plan: Cambell Briana Farner is a 34 y.o F G3P3003 who is recovering well without complaints on PPD #1 s/p NSVD at [redacted]w[redacted]d.  Patient feels well, pain is well controlled, patient is ambulating well, and PE is reassuring.  Discharge home, Breastfeeding, Lactation consult and Contraception OCPs     LOS: 1 day   [redacted]w[redacted]d Antonio Creswell 11/13/2019, 7:39 AM

## 2019-11-13 NOTE — Progress Notes (Signed)
Discharge education and instructions reviewed with patient using interpreter # [phone], questions answered and no further concerns at this time, awaiting baby's 24 hr labs to know status of baby D/C.

## 2019-11-13 NOTE — Lactation Note (Signed)
This note was copied from a baby's chart. Lactation Consultation Note Used interpreter [employer] services Vicente Serene [phone] for consult. Baby 6 hrs old. Mom is GDM. Mom is BR/Formula. Hasn't tried to put baby to the breast as of yet. Mom has been giveing Gerber formula. Attempted to latch in foot ball position. Baby wouldn't latch well. Not interested in feeding. Baby gagging occasionally just laying in mom's arms. Mom has Large everted nipples. Just does fit in baby's mouth w/chin tug assistance. Baby doesn't open very wide at this time. Hand expression w/colostrum noted. Mom has 2 other children that she formula and BF, mostly formula. Encouraged mom to try BF first then formula feeding. Baby wouldn't be interested after being full on formula.  Newborn feeding habits, I&O, STS, breast massage, supply and demand discussed. Mom stated she had no questions or concerns. Encouraged to call for assistance.  Patient Name: Alyssa [name] Today's Date: 11/13/2019 Reason for consult: Initial assessment;Early term 37-38.6wks   Maternal Data Has patient been taught Hand Expression?: Yes Does the patient have breastfeeding experience prior to this delivery?: Yes  Feeding Feeding Type: Formula Nipple Type: Slow - flow  LATCH Score Latch: Too sleepy or reluctant, no latch achieved, no sucking elicited.  Audible Swallowing: None  Type of Nipple: Everted at rest and after stimulation  Comfort (Breast/Nipple): Soft / non-tender  Hold (Positioning): Full assist, staff holds infant at breast  LATCH Score: 4  Interventions Interventions: Breast feeding basics reviewed;Support pillows;Assisted with latch;Position options;Skin to skin;Breast massage;Hand express;Adjust position;Breast compression  Lactation Tools Discussed/Used WIC Program: Yes   Consult Status Consult Status: Follow-up Date: 11/13/19 Follow-up type: In-patient    Charyl Dancer 11/13/2019, 12:41 AM

## 2019-11-17 LAB — SURGICAL PATHOLOGY

## 2019-11-19 ENCOUNTER — Other Ambulatory Visit (HOSPITAL_COMMUNITY): Admission: RE | Admit: 2019-11-19 | Payer: PRIVATE HEALTH INSURANCE | Source: Ambulatory Visit

## 2019-11-19 ENCOUNTER — Encounter: Payer: PRIVATE HEALTH INSURANCE | Admitting: Obstetrics and Gynecology

## 2019-11-21 ENCOUNTER — Inpatient Hospital Stay (HOSPITAL_COMMUNITY): Payer: Medicaid Other

## 2019-12-18 ENCOUNTER — Telehealth: Payer: PRIVATE HEALTH INSURANCE | Admitting: Obstetrics and Gynecology

## 2019-12-24 ENCOUNTER — Other Ambulatory Visit: Payer: Self-pay | Admitting: *Deleted

## 2019-12-24 DIAGNOSIS — Z8632 Personal history of gestational diabetes: Secondary | ICD-10-CM

## 2019-12-25 ENCOUNTER — Other Ambulatory Visit: Payer: Self-pay

## 2019-12-25 DIAGNOSIS — Z8632 Personal history of gestational diabetes: Secondary | ICD-10-CM

## 2019-12-27 LAB — GLUCOSE TOLERANCE, 2 HOURS
Glucose, 2 hour: 111 mg/dL (ref 65–139)
Glucose, GTT - Fasting: 87 mg/dL (ref 65–99)

## 2019-12-30 ENCOUNTER — Telehealth (INDEPENDENT_AMBULATORY_CARE_PROVIDER_SITE_OTHER): Payer: Self-pay | Admitting: Medical

## 2019-12-30 ENCOUNTER — Other Ambulatory Visit: Payer: Self-pay

## 2019-12-30 ENCOUNTER — Encounter: Payer: Self-pay | Admitting: Medical

## 2019-12-30 DIAGNOSIS — Z8632 Personal history of gestational diabetes: Secondary | ICD-10-CM

## 2019-12-30 NOTE — Progress Notes (Addendum)
I connected with Alyssa [name]  12/30/19 at 10:35 AM EST by: MyChart and verified that I am speaking with the correct person using two identifiers.  Patient is located at [facility] and provider is located at [place].     The purpose of this virtual visit is to provide medical care while limiting exposure to the novel coronavirus. I discussed the limitations, risks, security and privacy concerns of performing an evaluation and management service by MyChart and the availability of in person appointments. I also discussed with the patient that there may be a patient responsible charge related to this service. By engaging in this virtual visit, you consent to the provision of healthcare.  Additionally, you authorize for your insurance to be billed for the services provided during this visit.  The patient expressed understanding and agreed to proceed.  The following staff members participated in the virtual visit:  Vonzella Nipple, [facility], Virgina Evener, CMA  Post Partum Visit Note Subjective:    Alyssa [name] is a 34 y.o. [phone] female who presents for a postpartum visit. She is 10 weeks postpartum following a NSVD. I have fully reviewed the prenatal and intrapartum course. The delivery was at 38.4 gestational weeks. Outcome: spontaneous vaginal delivery. Anesthesia: none. Postpartum course has been Unremarkable. Baby's course has been unremarkable. Baby is feeding by bottle - gerber. Bleeding no bleeding. Bowel function is abnormal: constipation. Bladder function is normal. Patient is not sexually active. Contraception method is condoms. Postpartum depression screening: Negative.  The following portions of the patient's history were reviewed and updated as appropriate: allergies, current medications, past family history, past medical history, past social history, past surgical history and problem list.  Review of Systems Pertinent items are noted in HPI.   Objective:  There were no vitals  filed for this visit. Patient no longer has access to a functioning BP cuff. Patient denies current HA, visual changes or CP.        Assessment:    Normal postpartum exam. Pap smear not done at today's visit. Last pap smear 04/2019 and results were normal. Next pap due 04/2022.  History of GDM Plan:    1. Contraception: condoms 2.  Patient will [facility] an [facility] at the [facility] if she desires other birth control due to self pay status  3. Normal PP GTT last week. Patient informed  4. Follow up in: as needed   8 minutes of non-face-to-face time spent with the patient   Alyssa [name] 12/30/2019 11:06 AM

## 2019-12-30 NOTE — Patient Instructions (Signed)
Eleccin del mtodo anticonceptivo Contraception Choices La anticoncepcin, o los mtodos anticonceptivos, son medidas que se toman o maneras a fin de intentar no quedar embarazada. Mtodo anticonceptivo hormonal Este tipo de mtodo anticonceptivo contiene hormonas. A continuacin se mencionan algunos tipos de mtodos anticonceptivos hormonales:  Un tubo que se coloca debajo de la piel del brazo (implante). El tubo puede permanecer en el lugar durante 3aos.  Inyecciones que debe recibir cada 3meses.  Pldoras que debe tomar todos los das (pldoras anticonceptivas).  Un parche que se debe cambiar 1vez por semana durante 3semanas (parche anticonceptivo). Despus de ese tiempo, el parche se debe retirar durante 1semana.  Un anillo que se coloca en la vagina. El anillo se deja colocado durante 3 semanas. Luego, se debe retirar de la vagina durante 1semana. Luego, se coloca un nuevo anillo en la vagina.  Pldoras que se deben tomar despus de tener sexo sin proteccin (pldoras anticonceptivas de emergencia). Mtodos anticonceptivos de barrera A continuacin se mencionan algunos tipos de mtodos anticonceptivos de barrera:  Una cubierta delgada que se coloca sobre el pene antes de tener sexo (preservativo masculino). La cubierta se desecha despus de tener sexo.  Una cubierta blanda y suelta que se coloca en la vagina antes de tener sexo (preservativo femenino). La cubierta se desecha despus de tener sexo.  Un dispositivo de goma que se aplica sobre el cuello uterino (diafragma). Este dispositivo debe fabricarse para usted. Se coloca en la vagina antes de tener sexo. Se debe dejar colocado durante 6 a 8horas despus de tener sexo. Se debe retirar en un plazo de 24horas.  Un capuchn pequeo y suave que se fija sobre el cuello uterino (capuchn cervical). Este capuchn debe fabricarse para usted. Se debe dejar colocado durante 6 a 8horas despus de tener sexo. Se debe retirar en un  plazo de 48horas.  Una esponja que se coloca en la vagina antes de tener sexo. Se debe dejar colocada durante al menos 6horas despus de tener sexo. Se debe retirar en un plazo de 30horas. Luego, debe desecharse.  Una sustancia qumica que destruye o impide que los espermatozoides ingresen al tero (espermicida). Se puede presentar en forma de pldora, crema, gel o espuma y se debe colocar en la vagina. La sustancia qumica se debe usar al menos de 10 a 15minutos antes de tener sexo. Dispositivo anticonceptivo intrauterino (DIU) El DIU es un pequeo dispositivo plstico en forma de T. Se coloca en el interior del tero. Existen dos tipos diferentes:  DIU hormonal. Este tipo puede permanecer colocado durante 3 a 5aos.  DIU de cobre. Este tipo puede permanecer colocado durante 10aos. Mtodos anticonceptivos permanentes A continuacin se mencionan algunos tipos de mtodos anticonceptivos permanentes:  Ciruga para obstruir las trompas de Falopio.  Colocacin de un dispositivo en cada una de las trompas de Falopio.  Ciruga para atar los conductos que transportan el esperma (vasectoma). Mtodos anticonceptivos por planificacin natural A continuacin se mencionan algunos mtodos anticonceptivos por planificacin natural:  No tener sexo en los das frtiles de la mujer.  Usar un calendario a fin de: ? Llevar un registro de la duracin de cada perodo. ? Determinar en qu das se podra producir el embarazo. ? Planificar no tener sexo en los das en que se podra producir el embarazo.  Reconocer los sntomas de la ovulacin y no tener sexo durante la ovulacin. Una manera en que la mujer puede detectar la ovulacin es controlarse la temperatura.  Esperar para tener sexo hasta despus de la   ovulacin. Resumen  La anticoncepcin, o los mtodos anticonceptivos, son medidas que se toman o maneras a fin de intentar no quedar embarazada.  Los mtodos anticonceptivos hormonales  incluyen implantes, inyecciones, pldoras, parches, anillos vaginales y pldoras anticonceptivas de emergencia.  Los mtodos anticonceptivos de barrera pueden incluir preservativos masculinos, preservativos femeninos, diafragmas, capuchones cervicales, esponjas y espermicidas.  Existen dos tipos diferentes de DIU (dispositivo intrauterino) anticonceptivo. Un DIU puede colocarse en el tero de una mujer para evitar el embarazo durante 3 a 5 aos.  La esterilizacin permanente puede realizarse mediante un procedimiento para hombres, mujeres o ambos.  Los mtodos anticonceptivos por planificacin familiar natural incluyen no tener sexo en los das frtiles de la mujer. Esta informacin no tiene como fin reemplazar el consejo del mdico. Asegrese de hacerle al mdico cualquier pregunta que tenga. Document Revised: 06/19/2018 Document Reviewed: 06/06/2017 Elsevier Patient Education  2020 [employer]  

## 2020-02-09 ENCOUNTER — Ambulatory Visit: Payer: Medicaid Other | Attending: Internal Medicine

## 2020-02-09 DIAGNOSIS — Z20822 Contact with and (suspected) exposure to covid-19: Secondary | ICD-10-CM

## 2020-02-09 DIAGNOSIS — U071 COVID-19: Secondary | ICD-10-CM | POA: Insufficient documentation

## 2020-02-10 LAB — NOVEL CORONAVIRUS, NAA: SARS-CoV-2, NAA: DETECTED — AB

## 2020-02-10 LAB — SARS-COV-2, NAA 2 DAY TAT

## 2021-05-28 IMAGING — US US MFM FETAL BPP W/O NON-STRESS
1 series · 13 of 21 positions shown · non-contrast
Comparison: none

[Series 1: us mfm fetal bpp w/o non-stress · 21 acquisitions, 13 frames shown]
[im 1/21]
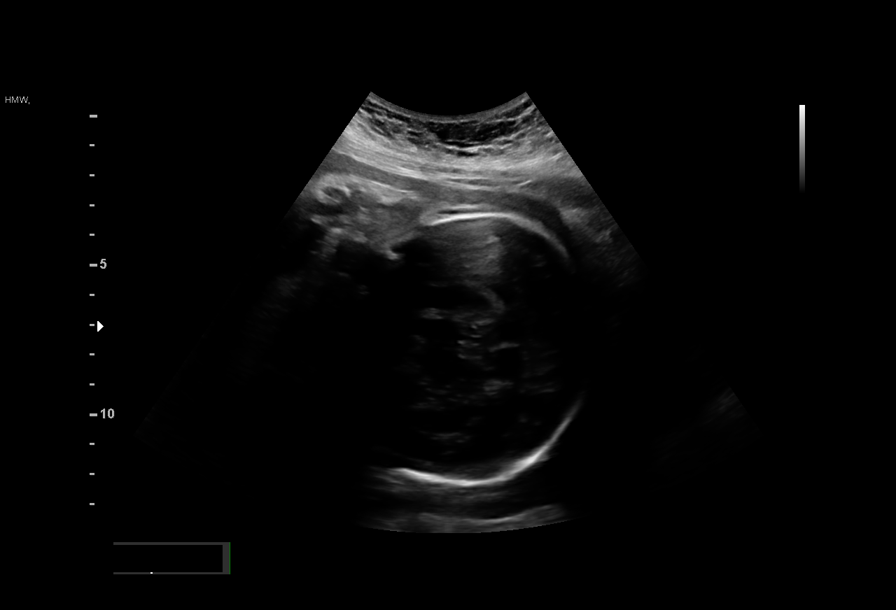
[im 3/21]
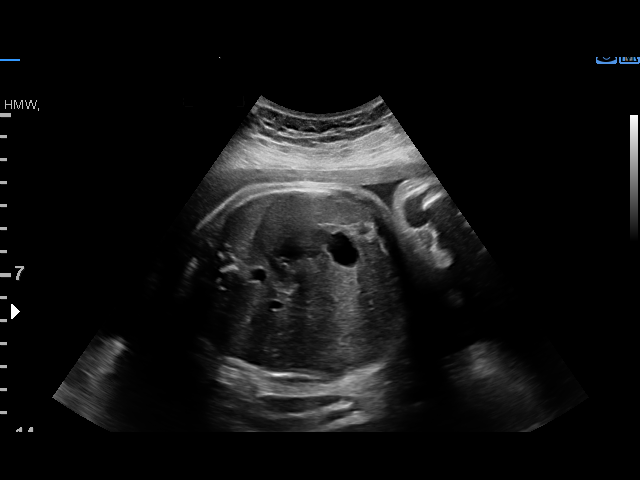
[im 5/21]
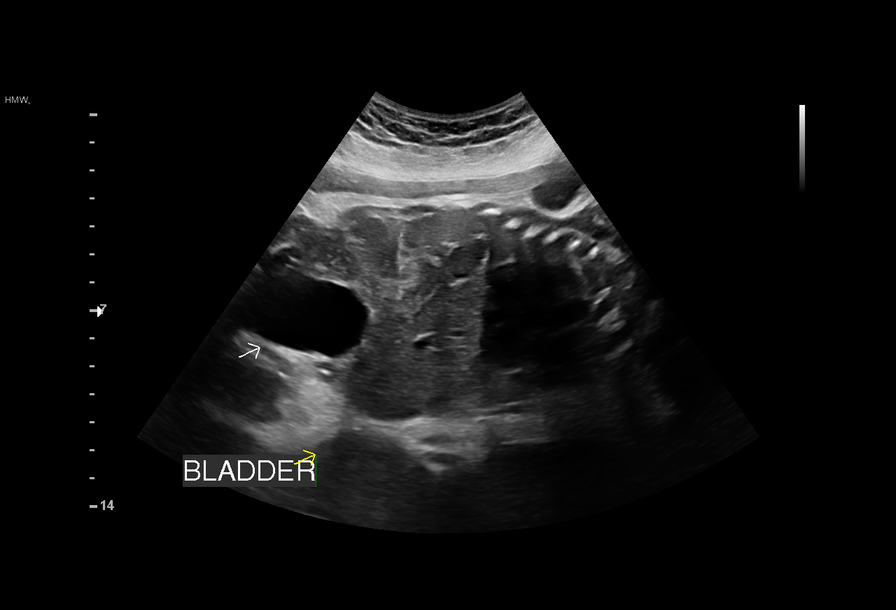
[im 6/21]
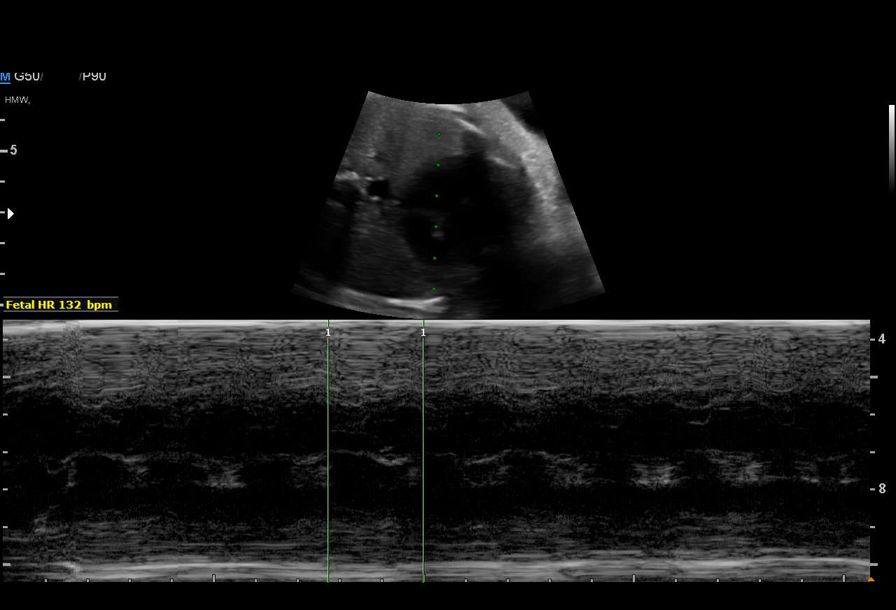
[im 8/21]
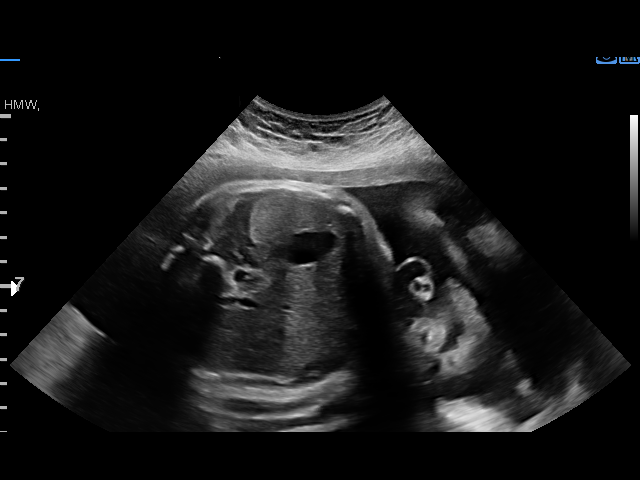
[im 9/21]
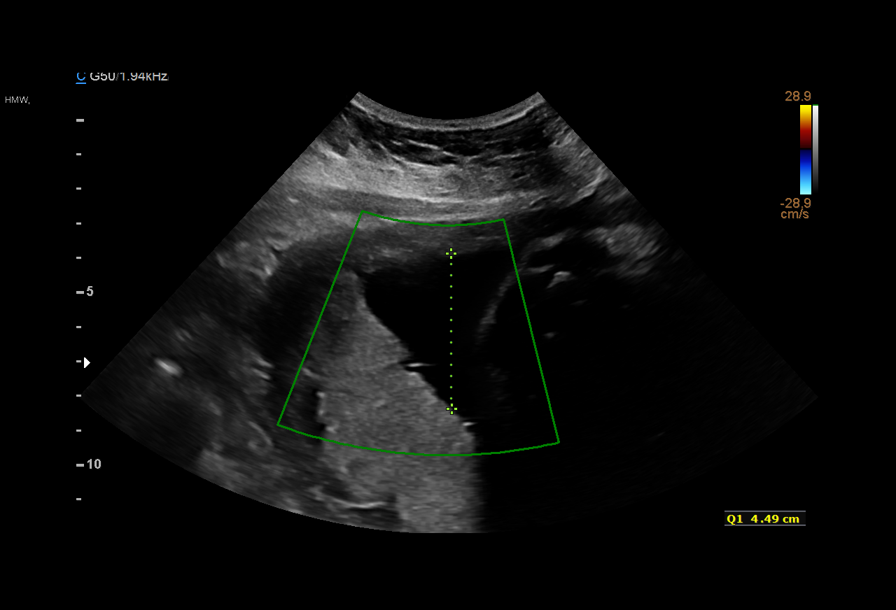
[im 11/21]
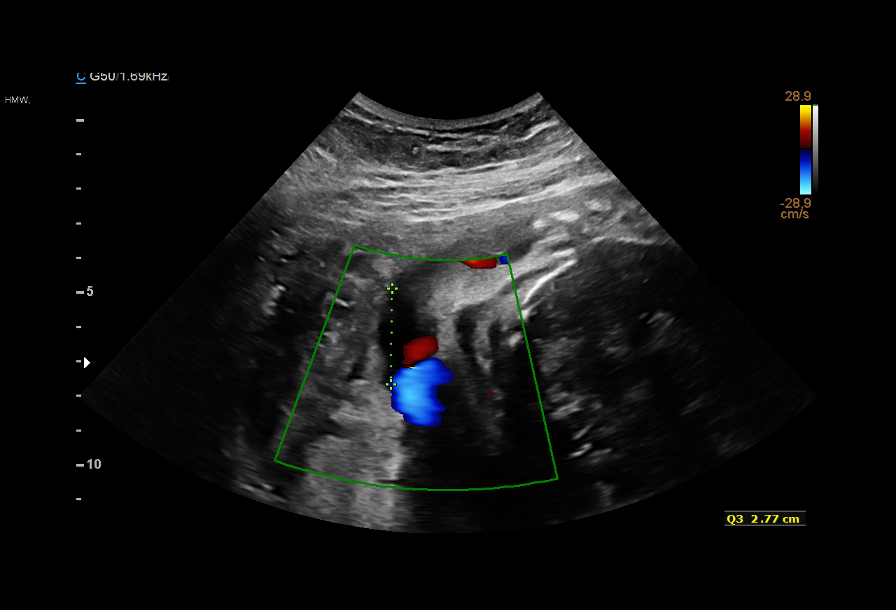
[im 13/21]
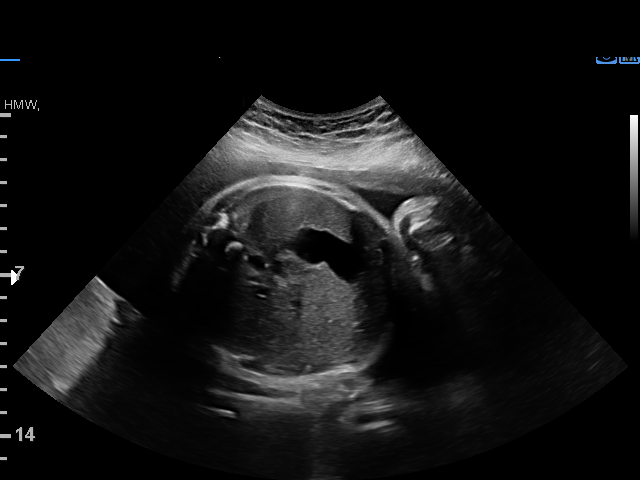
[im 14/21]
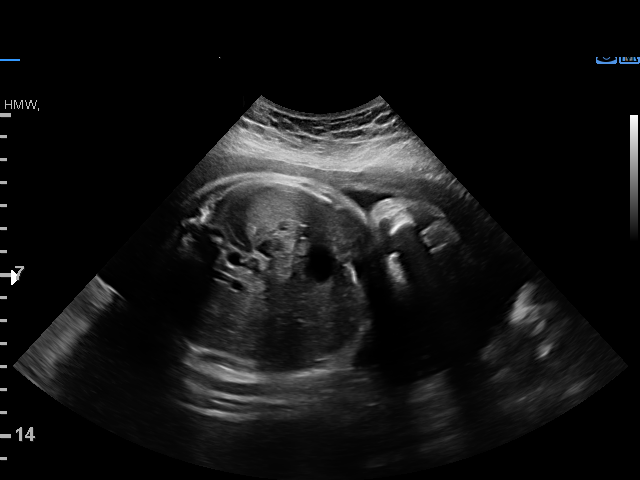
[im 16/21]
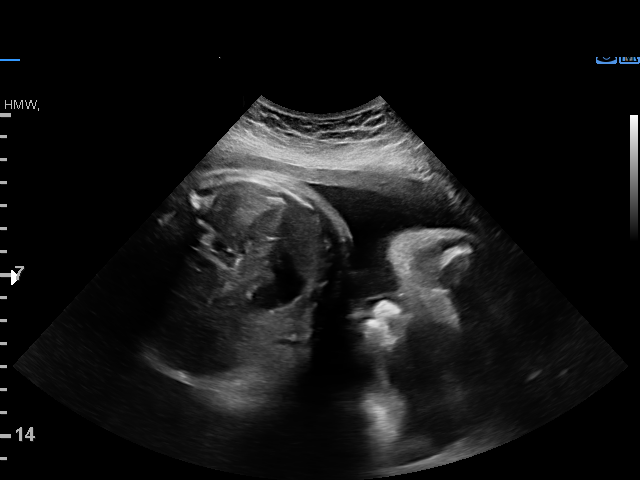
[im 17/21]
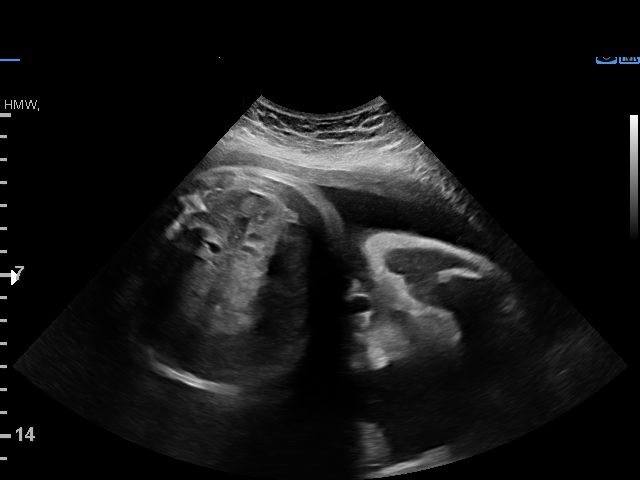
[im 19/21]
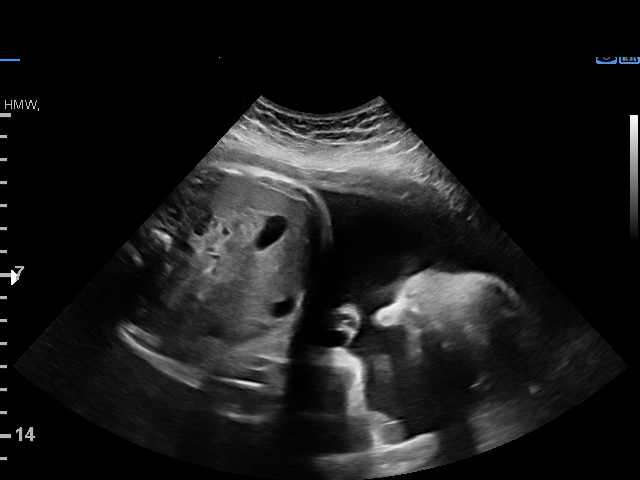
[im 21/21]
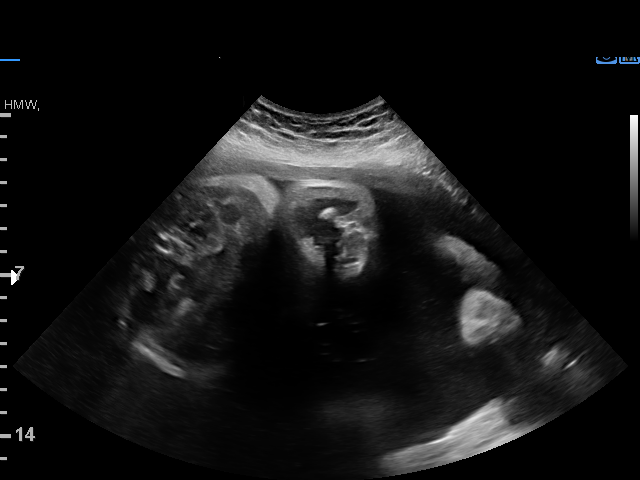

[13 of 21 positions shown; findings below may reference images not displayed]

[REDACTED] Health-
                   Faculty Physician

 ----------------------------------------------------------------------

 ----------------------------------------------------------------------
Indications

  33 weeks gestation of pregnancy
  Gestational diabetes in pregnancy,
  controlled by oral hypoglycemic drugs
  Advanced maternal age multigravida 35+,
  third trimester
  Obesity complicating pregnancy, third
  trimester
  Maternal care for excessive fetal growth,
  third trimester, fetus unspecified
  Encounter for other antenatal screening
  follow-up
 ----------------------------------------------------------------------
Vital Signs

                                                Height:        5'2"
Fetal Evaluation

 Num Of Fetuses:          1
 Fetal Heart Rate(bpm):   132
 Cardiac Activity:        Observed
 Presentation:            Cephalic

 Amniotic Fluid
 AFI FV:      Within normal limits

 AFI Sum(cm)     %Tile       Largest Pocket(cm)
 17.92           66
 RUQ(cm)       RLQ(cm)       LUQ(cm)        LLQ(cm)

Biophysical Evaluation

 Amniotic F.V:   Within normal limits       F. Tone:         Observed
 F. Movement:    Observed                   Score:           [DATE]
 F. Breathing:   Observed
OB History

 Gravidity:    3         Term:   2        Prem:   0        SAB:   0
 TOP:          0       Ectopic:  0        Living: 2
Gestational Age

 LMP:           33w 4d        Date:  02/15/19                 EDD:   11/22/19
 Best:          33w 4d     Det. By:  LMP  (02/15/19)          EDD:   11/22/19
Anatomy

 Thoracic:              Appears normal         Bladder:                Appears normal
 Stomach:               Appears normal, left
                        sided
Impression

 Biophysical profile [DATE]
 [id number]
 AMA
Recommendations

 Continue weekly BPP
 Follow up growth in 3 weeks.

## 2021-06-05 ENCOUNTER — Other Ambulatory Visit: Payer: Self-pay

## 2021-06-05 ENCOUNTER — Encounter (HOSPITAL_COMMUNITY): Payer: Self-pay

## 2021-06-05 ENCOUNTER — Ambulatory Visit (HOSPITAL_COMMUNITY)
Admission: EM | Admit: 2021-06-05 | Discharge: 2021-06-05 | Disposition: A | Discharge status: Home / Self Care | Payer: Self-pay | Attending: Physician Assistant | Admitting: Physician Assistant

## 2021-06-05 DIAGNOSIS — R519 Headache, unspecified: Secondary | ICD-10-CM

## 2021-06-05 MED ORDER — SUMATRIPTAN SUCCINATE 50 MG PO TABS: 50.0000 mg | ORAL_TABLET | Freq: Once | ORAL | 2 refills | Status: AC | PRN

## 2021-06-05 NOTE — ED Triage Notes (Signed)
Pt present headache with nausea and light sensitive. Symptom started four days ago.

## 2021-06-05 NOTE — ED Provider Notes (Signed)
[facility]    CSN: [id number] Arrival date & time: 06/05/21  1012      History   Chief Complaint Chief Complaint  Patient presents with   Headache    HPI Alyssa [name] is a 35 y.o. female.   The history is provided by the patient. No language interpreter was used.  Headache Pain location:  Generalized Radiates to:  Does not radiate Onset quality:  Gradual Duration:  4 weeks Timing:  Constant Chronicity:  New Relieved by:  Nothing Ineffective treatments:  None tried  Pt complains of headaches for a long time  normally relieved with advil.  Pt has noticed knots on her scalp[,   Past Medical History:  Diagnosis Date   Gestational diabetes 05/29/2011   Obese     Patient Active Problem List   Diagnosis Date Noted   History of gestational diabetes mellitus (GDM) 12/30/2019   Language barrier 06/06/2019    History reviewed. No pertinent surgical history.  OB History     Gravida  3   Para  3   Term  3   Preterm  0   AB  0   Living  3      SAB  0   IAB  0   Ectopic  0   Multiple  0   Live Births  3            Home Medications    Prior to Admission medications   Medication Sig Start Date End Date Taking? Authorizing Provider  ibuprofen (ADVIL) 600 MG tablet Take 1 tablet (600 mg total) by mouth every 6 (six) hours as needed. Patient not taking: Reported on 12/30/2019 11/13/19   Arabella Merles, CNM  Prenatal Vit-Fe Fumarate-FA (PRENATAL VITAMIN) 27-0.8 MG TABS Take 1 tablet by mouth daily. 06/06/19   Reva Bores, MD    Family History Family History  Problem Relation Age of Onset   Diabetes Mother     Social History Social History   Tobacco Use   Smoking status: Never   Smokeless tobacco: Never  Vaping Use   Vaping Use: Never used  Substance Use Topics   Alcohol use: No   Drug use: No     Allergies   Patient has no known allergies.   Review of Systems Review of Systems  Neurological:  Positive for  headaches.  All other systems reviewed and are negative.   Physical Exam Triage Vital Signs ED Triage Vitals  Enc Vitals Group     BP 06/05/21 1146 135/88     Pulse Rate 06/05/21 1146 78     Resp 06/05/21 1146 18     Temp 06/05/21 1146 98.4 F (36.9 C)     Temp Source 06/05/21 1146 Oral     SpO2 06/05/21 1146 98 %     Weight --      Height --      Head Circumference --      Peak Flow --      Pain Score 06/05/21 1149 10     Pain Loc --      Pain Edu? --      Excl. in GC? --    No data found.  Updated Vital Signs BP 135/88 (BP Location: Right Arm)   Pulse 78   Temp 98.4 F (36.9 C) (Oral)   Resp 18   SpO2 98%   Visual Acuity Right Eye Distance:   Left Eye Distance:   Bilateral Distance:  Right Eye Near:   Left Eye Near:    Bilateral Near:     Physical Exam Vitals and nursing note reviewed.  Constitutional:      General: She is not in acute distress.    Appearance: She is well-developed.  HENT:     Head: Normocephalic and atraumatic.  Eyes:     Extraocular Movements: Extraocular movements intact.     Conjunctiva/sclera: Conjunctivae normal.  Cardiovascular:     Rate and Rhythm: Normal rate and regular rhythm.     Heart sounds: No murmur heard. Pulmonary:     Effort: Pulmonary effort is normal. No respiratory distress.     Breath sounds: Normal breath sounds.  Abdominal:     Palpations: Abdomen is soft.     Tenderness: There is no abdominal tenderness.  Musculoskeletal:     Cervical back: Neck supple.  Skin:    General: Skin is warm and dry.  Neurological:     Mental Status: She is alert and oriented to person, place, and time.     UC Treatments / Results  Labs (all labs ordered are listed, but only abnormal results are displayed) Labs Reviewed - No data to display  EKG   Radiology No results found.  Procedures Procedures (including critical care time)  Medications Ordered in UC Medications - No data to display  Initial Impression  / Assessment and Plan / UC Course  I have reviewed the triage vital signs and the nursing notes.  Pertinent labs & imaging results that were available during my care of the patient were reviewed by me and considered in my medical decision making (see chart for details).     MDM:  Pt advised to follow up with neurology if headaches persist  Final Clinical Impressions(s) / UC Diagnoses   Final diagnoses:  Nonintractable headache, unspecified chronicity pattern, unspecified headache type     Discharge Instructions      See your Physician for recheck.  Have your blood pressure rechecked in 3 months    ED Prescriptions     Medication Sig Dispense Auth. Provider   SUMAtriptan (IMITREX) 50 MG tablet Take 1 tablet (50 mg total) by mouth once as needed for migraine. May repeat in 2 hours if headache persists or recurs. 30 tablet Elson Areas, [place]      PDMP not reviewed this encounter.   Elson Areas, [place] 06/05/21 1238

## 2021-06-05 NOTE — Discharge Instructions (Addendum)
See your Physician for recheck.  Have your blood pressure rechecked in 3 months

## 2021-08-01 ENCOUNTER — Telehealth: Payer: Self-pay | Admitting: Psychiatry

## 2021-08-01 ENCOUNTER — Encounter: Payer: Self-pay | Admitting: Psychiatry

## 2021-08-01 ENCOUNTER — Ambulatory Visit (INDEPENDENT_AMBULATORY_CARE_PROVIDER_SITE_OTHER): Payer: Self-pay | Admitting: Psychiatry

## 2021-08-01 VITALS — BP 122/82 | HR 88 | Ht 65.0 in | Wt 237.0 lb

## 2021-08-01 DIAGNOSIS — G43009 Migraine without aura, not intractable, without status migrainosus: Secondary | ICD-10-CM

## 2021-08-01 DIAGNOSIS — R51 Headache with orthostatic component, not elsewhere classified: Secondary | ICD-10-CM

## 2021-08-01 MED ORDER — RIZATRIPTAN BENZOATE 10 MG PO TABS: 10.0000 mg | ORAL_TABLET | ORAL | 3 refills | Status: DC | PRN

## 2021-08-01 MED ORDER — TOPIRAMATE 25 MG PO TABS: ORAL_TABLET | ORAL | 3 refills | Status: DC

## 2021-08-01 NOTE — Patient Instructions (Addendum)
CT brain Start Topamax for headache prevention. Take 25 mg (1 pill) at bedtime for one week, then increase to 2 pills at bedtime for one week, then increase to 3 pills at bedtime for one week, then increase to 4 pills at bedtime. Let me know once you reach 4 pills. Start Maxalt 10 mg as needed. Take one pill at the onset of migraine. If headache recurs or does not fully resolve, you may take a second dose after 2 hours. Please avoid taking more than 2 days per week or 10 days per month.

## 2021-08-01 NOTE — Progress Notes (Signed)
Referring:  No referring provider defined for this encounter.  PCP: Patient, No Pcp Per (Inactive)  Neurology was asked to evaluate Alyssa [name], a 35 year old female for a chief complaint of headaches.  Our recommendations of care will be communicated by shared medical record.    CC:  headaches  HPI:  Medical co-morbidities: none  The patient presents for evaluation of headaches which have been present over the past 6 months. No clear inciting triggers. She does have a history of bad headaches where she would need to lie down, but these would resolve with Advil. She has tried Advil now but it does not help. Current headache occurs 2-3 times per week. They can last for several hours at a time. Headaches are worse with lying down and coughing. They will wake her up from sleep. They have been worsening over time.  Was prescribed Imitrex but was unable to afford it.  Headache History: Onset: 6 months ago Triggers: no Most common time of day for headache to begin: any time Aura: blurred Location: occipital, vertex Quality/Description: throbbing Associated Symptoms:  Photophobia: yes  Phonophobia: yes  Nausea: yes Vomiting: no +tinnitus Worse with activity?: yes Duration of headaches: several hours Red flags:   Change in pattern of headache  Positional  Headache days per month: 12 Headache free days per month: 18  Current Treatment: Abortive Advil  Preventative none  Prior Therapies                                 Duration of Use           Dose                          Side effect Advil   Headache Risk Factors: Headache risk factors and/or co-morbidities (+) Neck Pain (+) Obesity  Body mass index is 39.44 kg/m. (+) Sleep disorder - unable to sleep at night due to pain, told she snores at night  LABS: CBC    Component Value Date/Time   WBC 8.2 11/12/2019 1638   RBC 3.97 11/12/2019 1638   HGB 12.4 11/12/2019 1638   HGB 11.6 09/03/2019 1618    HGB 11.6 05/13/2019 0000   HCT 37.1 11/12/2019 1638   HCT 35.8 09/03/2019 1618   HCT 36 05/13/2019 0000   PLT 196 11/12/2019 1638   PLT 224 09/03/2019 1618   MCV 93.5 11/12/2019 1638   MCV 92 09/03/2019 1618   MCH 31.2 11/12/2019 1638   MCHC 33.4 11/12/2019 1638   RDW 12.7 11/12/2019 1638   RDW 12.9 09/03/2019 1618   LYMPHSABS 1.9 01/27/2011 0132   MONOABS 0.4 01/27/2011 0132   EOSABS 0.1 01/27/2011 0132   BASOSABS 0.0 01/27/2011 0132   CMP     Component Value Date/Time   NA 138 06/06/2019 1124   K 4.1 06/06/2019 1124   CL 102 06/06/2019 1124   CO2 22 06/06/2019 1124   GLUCOSE 91 06/06/2019 1124   GLUCOSE 86 05/24/2011 1055   BUN 7 06/06/2019 1124   CREATININE 0.40 (L) 06/06/2019 1124   CALCIUM 9.8 06/06/2019 1124   PROT 6.6 06/06/2019 1124   ALBUMIN 4.1 06/06/2019 1124   AST 15 06/06/2019 1124   ALT 17 06/06/2019 1124   ALKPHOS 54 06/06/2019 1124   BILITOT <0.2 06/06/2019 1124   GFRNONAA 136 06/06/2019 1124   GFRAA 157 06/06/2019 1124  IMAGING:  none   Current Outpatient Medications on File Prior to Visit  Medication Sig Dispense Refill   ibuprofen (ADVIL) 600 MG tablet Take 1 tablet (600 mg total) by mouth every 6 (six) hours as needed. 30 tablet 1   Prenatal Vit-Fe Fumarate-FA (PRENATAL VITAMIN) 27-0.8 MG TABS Take 1 tablet by mouth daily. 90 tablet 3   SUMAtriptan (IMITREX) 50 MG tablet Take 1 tablet (50 mg total) by mouth once as needed for migraine. May repeat in 2 hours if headache persists or recurs. (Patient not taking: Reported on 08/01/2021) 30 tablet 2   No current facility-administered medications on file prior to visit.     Allergies: No Known Allergies  Family History: Migraine or other headaches in the family:  no Aneurysms in a first degree relative:  no Brain tumors in the family:  no Other neurological illness in the family:   no  Past Medical History: Past Medical History:  Diagnosis Date   Gestational diabetes 05/29/2011   Obese      Past Surgical History History reviewed. No pertinent surgical history.  Social History: Social History   Tobacco Use   Smoking status: Never   Smokeless tobacco: Never  Vaping Use   Vaping Use: Never used  Substance Use Topics   Alcohol use: No   Drug use: No    ROS: Negative for fevers, chills. Positive for headaches. All other systems reviewed and negative unless stated otherwise in HPI.   Physical Exam:   Vital Signs: BP 122/82   Pulse 88   Ht 5\' 5"  (1.651 m)   Wt 237 lb (107.5 kg)   SpO2 97%   BMI 39.44 kg/m  GENERAL: well appearing,in no acute distress,alert SKIN:  Color, texture, turgor normal. No rashes or lesions HEAD:  Normocephalic/atraumatic. CV:  RRR RESP: Normal respiratory effort MSK: +tenderness to palpation over right neck and shoulders  NEUROLOGICAL: Mental Status: Alert, oriented to person, place and time,Follows commands Cranial Nerves: PERRL, fundi poorly visualized due to photophobia, visual fields intact to confrontation,extraocular movements intact,facial sensation intact,no facial droop or ptosis,hearing intact to finger rub bilaterally,no dysarthria,palate elevate symmetrically,tongue protrudes midline,shoulder shrug intact and symmetric Motor: muscle strength 5/5 both upper and lower extremities,no drift, normal tone Reflexes: 2+ throughout Sensation: intact to light touch all 4 extremities Coordination: Finger-to- nose-finger intact bilaterally,Heel-to-shin intact bilaterally Gait: normal-based   IMPRESSION: 35 year old female who presents for evaluation of worsening headaches over the past 6 months. Headache pattern is consistent with migraine without aura. Suspect she has a history of migraines as she describes previous severe headaches. Will order [facility] given positional component to her headaches (will defer MRI at this time as she is out of pocket). Will start Topamax for prevention and Maxalt for rescue.  PLAN: -CT  brain -Preventive: Start Topamax 25 mg QHS, uptitrate by 25 mg each week up to 100 mg QHS -Abortive: Start Maxalt 10 mg PRN -next steps: consider sleep study, consider ophtho eval and LP if headaches/blurred vision worsens   Headache education was done. Discussed treatment options including preventive and acute medications, natural supplements, and physical therapy. Discussed medication overuse headache and to limit use of acute treatments to no more than 2 days/week or 10 days/month. Discussed medication side effects, adverse reactions and drug interactions. Written educational materials and patient instructions outlining all of the above were given.  Follow-up: 3 months   [facility], MD 08/01/2021   9:09 AM

## 2021-08-01 NOTE — Telephone Encounter (Signed)
self pay order sent to [facility], they will reach out to the patient to schedule.  

## 2021-08-02 ENCOUNTER — Other Ambulatory Visit: Payer: Self-pay

## 2021-08-02 ENCOUNTER — Ambulatory Visit
Admission: RE | Admit: 2021-08-02 | Discharge: 2021-08-02 | Disposition: A | Discharge status: Home / Self Care | Payer: No Typology Code available for payment source | Source: Ambulatory Visit | Attending: Psychiatry | Admitting: Psychiatry

## 2021-08-02 DIAGNOSIS — R51 Headache with orthostatic component, not elsewhere classified: Secondary | ICD-10-CM

## 2021-08-08 ENCOUNTER — Telehealth: Payer: Self-pay

## 2021-08-08 NOTE — Telephone Encounter (Signed)
Contacted pt, via cone language interpreter line, informed her that CT scan is normal. Advised her to call the office with questions, number provided.

## 2021-08-08 NOTE — Telephone Encounter (Signed)
-----   Message from Ocie Doyne, MD sent at 08/08/2021  8:21 AM EDT ----- CT scan is normal

## 2021-11-01 ENCOUNTER — Ambulatory Visit (INDEPENDENT_AMBULATORY_CARE_PROVIDER_SITE_OTHER): Payer: Self-pay | Admitting: Psychiatry

## 2021-11-01 ENCOUNTER — Encounter: Payer: Self-pay | Admitting: Psychiatry

## 2021-11-01 VITALS — BP 125/77 | HR 78 | Ht 65.0 in | Wt 236.0 lb

## 2021-11-01 DIAGNOSIS — G43709 Chronic migraine without aura, not intractable, without status migrainosus: Secondary | ICD-10-CM

## 2021-11-01 MED ORDER — RIZATRIPTAN BENZOATE 10 MG PO TABS: 10.0000 mg | ORAL_TABLET | ORAL | 3 refills | Status: AC | PRN

## 2021-11-01 MED ORDER — TOPIRAMATE 100 MG PO TABS: 100.0000 mg | ORAL_TABLET | Freq: Every day | ORAL | 3 refills | Status: DC

## 2021-11-01 NOTE — Progress Notes (Signed)
° °  CC:  headaches  Follow-up Visit  Last visit: 08/01/21  Brief HPI: 36 year old female who follow in clinic for migraine headaches which began in April 2022.  CTH was ordered at her last visit as headaches had a positional component. Topamax was started for prevention and Maxalt was started for rescue.  Interval History: Since her last visit headaches have improved in frequency. She previously had 2-3 migraines per week, currently she is having 3-4 migraines per month. She is tolerating Topamax 100 mg QHS well without side effects.  Maxalt helps for rescue when she has a breakthrough migraine. It takes 30 minutes to start working, then completely resolves the headaches. She does not have any side effects with it. Will take Advil for her more mild headaches.  [facility] 08/02/21 was normal. Reviewed personally by me on 11/01/21.   Headache days per month: 3 Headache free days per month: 27  Current Headache Regimen: Preventative: Topamax 100 mg QHS Abortive: Maxalt 10 mg PRN  # of doses of abortive medications per month: 3  Prior Therapies                                  Advil  Physical Exam:   Vital Signs: BP 125/77    Pulse 78    Ht 5\' 5"  (1.651 m)    Wt 236 lb (107 kg)    BMI 39.27 kg/m  GENERAL:  well appearing, in no acute distress, alert  SKIN:  Color, texture, turgor normal. No rashes or lesions HEAD:  Normocephalic/atraumatic. RESP: normal respiratory effort MSK:  No gross joint deformities.   NEUROLOGICAL: Mental Status: Alert, oriented to person, place and time, Follows commands, and Speech fluent and appropriate. Cranial Nerves: PERRL, face symmetric, no dysarthria, hearing grossly intact Motor: moves all extremities equally Gait: normal-based.  IMPRESSION: 36 year old female who follows in clinic for migraines. She has had a significant reduction in migraine frequency with Topamax. Maxalt works well for rescue without side effects. Will continue current regimen for  now. Discussed that she can take Advil for more mild headaches, or combine Advil and Maxalt for more severe headaches if needed.   PLAN: -Preventive: Continue Topamax 100 mg QHS -Rescue: Continue Maxalt 10 mg PRN, can take Advil for more mild headaches -consider weaning Topamax if headaches remain stable over next 6 months   Follow-up: 6 months  I spent a total of 17 minutes on the date of the service. Headache education was done. Discussed medication side effects, adverse reactions and drug interactions.   30, MD 11/01/21 9:18 AM

## 2021-11-01 NOTE — Patient Instructions (Signed)
Continue Topamax daily at bedtime for headache prevention. Continue Maxalt as needed for migraines. You can take Advil for more mild headaches, or combine Advil and Maxalt for more severe headaches if needed.

## 2022-03-28 ENCOUNTER — Telehealth: Payer: Self-pay | Admitting: Psychiatry

## 2022-03-28 MED ORDER — TOPIRAMATE 100 MG PO TABS: 100.0000 mg | ORAL_TABLET | Freq: Every day | ORAL | 3 refills | Status: DC

## 2022-03-28 NOTE — Telephone Encounter (Signed)
Refill has been sent as requested.

## 2022-03-28 NOTE — Telephone Encounter (Signed)
Pt request refill for topiramate (TOPAMAX) 100 MG tablet at [facility] [id number]

## 2022-05-01 ENCOUNTER — Ambulatory Visit: Payer: Self-pay | Admitting: Psychiatry

## 2022-05-29 NOTE — Progress Notes (Unsigned)
   CC:  headaches  Follow-up Visit  Last visit: 11/01/21  Brief HPI: 36 year old female who follow in clinic for migraine headaches which began in April 2022.  At her last visit she was continued to Topamax for prevention and Maxalt for rescue.  Interval History: ***   Headache days per month: *** Headache free days per month: *** Headache severity: ***  Current Headache Regimen: Preventative: *** Abortive: ***  # of doses of abortive medications per month: ***  Prior Therapies                                  ***  Physical Exam:   Vital Signs: There were no vitals taken for this visit. GENERAL:  well appearing, in no acute distress, alert  SKIN:  Color, texture, turgor normal. No rashes or lesions HEAD:  Normocephalic/atraumatic. RESP: normal respiratory effort MSK:  No gross joint deformities.   NEUROLOGICAL: Mental Status: Alert, oriented to person, place and time, Follows commands, and Speech fluent and appropriate. Cranial Nerves: PERRL, face symmetric, no dysarthria, hearing grossly intact Motor: moves all extremities equally Gait: normal-based.  IMPRESSION: ***  PLAN: ***   Follow-up: ***  I spent a total of *** minutes on the date of the service. Headache education was done. Discussed lifestyle modification including increased oral hydration, decreased caffeine, exercise and stress management. Discussed treatment options including preventive and acute medications, natural supplements, and infusion therapy. Discussed medication overuse headache and to limit use of acute treatments to no more than 2 days/week or 10 days/month. Discussed medication side effects, adverse reactions and drug interactions. Written educational materials and patient instructions outlining all of the above were given.  Ocie Doyne, MD

## 2022-05-30 ENCOUNTER — Encounter: Payer: Self-pay | Admitting: Psychiatry

## 2022-05-30 ENCOUNTER — Ambulatory Visit (INDEPENDENT_AMBULATORY_CARE_PROVIDER_SITE_OTHER): Payer: Self-pay | Admitting: Psychiatry

## 2022-05-30 VITALS — BP 130/86 | HR 75 | Ht 65.0 in | Wt 232.2 lb

## 2022-05-30 DIAGNOSIS — G43009 Migraine without aura, not intractable, without status migrainosus: Secondary | ICD-10-CM

## 2022-05-30 MED ORDER — TOPIRAMATE 50 MG PO TABS: ORAL_TABLET | ORAL | 6 refills | Status: AC

## 2022-05-30 MED ORDER — DICLOFENAC POTASSIUM 50 MG PO TABS: ORAL_TABLET | ORAL | 6 refills | Status: DC

## 2022-05-30 NOTE — Patient Instructions (Addendum)
Increase Topamax to 50 mg in the morning and 100 mg at bedtime Start dicofenac as needed for migraines. You can take this with rizatriptan if needed

## 2022-12-22 LAB — GLUCOSE, POCT (MANUAL RESULT ENTRY): Glucose Fasting, POC: 110 mg/dL — AB (ref 70–99)

## 2022-12-22 NOTE — Progress Notes (Signed)
Pt is fasting. Pt needs PCP

## 2022-12-25 ENCOUNTER — Ambulatory Visit: Payer: Self-pay | Admitting: Psychiatry

## 2023-06-02 ENCOUNTER — Other Ambulatory Visit: Payer: Self-pay | Admitting: Psychiatry

## 2023-06-04 NOTE — Telephone Encounter (Signed)
Last seen on 05/31/23 No follow up scheduled

## 2024-05-25 ENCOUNTER — Other Ambulatory Visit: Payer: Self-pay

## 2024-05-25 ENCOUNTER — Encounter: Payer: Self-pay | Admitting: *Deleted

## 2024-05-25 ENCOUNTER — Ambulatory Visit
Admission: EM | Admit: 2024-05-25 | Discharge: 2024-05-25 | Disposition: A | Discharge status: Home / Self Care | Payer: Self-pay | Attending: Emergency Medicine | Admitting: Emergency Medicine

## 2024-05-25 DIAGNOSIS — J020 Streptococcal pharyngitis: Secondary | ICD-10-CM

## 2024-05-25 LAB — POCT RAPID STREP A (OFFICE): Rapid Strep A Screen: POSITIVE — AB

## 2024-05-25 MED ORDER — AMOXICILLIN 500 MG PO CAPS: 500.0000 mg | ORAL_CAPSULE | Freq: Two times a day (BID) | ORAL | 0 refills | Status: AC

## 2024-05-25 NOTE — ED Provider Notes (Signed)
 [facility]    CSN: [id number] Arrival date & time: 05/25/24  1021      History   Chief Complaint Chief Complaint  Patient presents with   Sore Throat    HPI Alyssa [name] is a 38 y.o. female.  Here with 2-day history of sore throat and ear pain Pain rated 7/10 No fever or cough  No known sick contacts or exposures  Took advil  last night   Past Medical History:  Diagnosis Date   Gestational diabetes 05/29/2011   Migraine    Obese     Patient Active Problem List   Diagnosis Date Noted   History of gestational diabetes mellitus (GDM) 12/30/2019   Language barrier 06/06/2019    History reviewed. No pertinent surgical history.  OB History     Gravida  3   Para  3   Term  3   Preterm  0   AB  0   Living  3      SAB  0   IAB  0   Ectopic  0   Multiple  0   Live Births  3            Home Medications    Prior to Admission medications   Medication Sig Start Date End Date Taking? Authorizing Provider  amoxicillin  (AMOXIL ) 500 MG capsule Take 1 capsule (500 mg total) by mouth 2 (two) times daily for 10 days. 05/25/24 06/04/24 Yes Rasaan Brotherton, PA-C  diclofenac  (CATAFLAM ) 50 MG tablet TAKE 1 TO 2 TABLETS BY MOUTH AS NEEDED FOR MIGRAINE HEADACHE. MAX DOSE 2 PILLS IN 24 HOURS 06/04/23   Rush Nest, MD  ibuprofen  (ADVIL ) 600 MG tablet Take 1 tablet (600 mg total) by mouth every 6 (six) hours as needed. 11/13/19   Loreli Suzen BIRCH, CNM  Prenatal Vit-Fe Fumarate-FA (PRENATAL VITAMIN) 27-0.8 MG TABS Take 1 tablet by mouth daily. 06/06/19   Fredirick Glenys RAMAN, MD  rizatriptan  (MAXALT ) 10 MG tablet Take 1 tablet (10 mg total) by mouth as needed for migraine. May repeat in 2 hours if needed 11/01/21   Rush Nest, MD  SUMAtriptan  (IMITREX ) 50 MG tablet Take 1 tablet (50 mg total) by mouth once as needed for migraine. May repeat in 2 hours if headache persists or recurs. 06/05/21 06/06/22  Flint Sonny POUR, PA-C  topiramate  (TOPAMAX ) 50 MG  tablet Take 50 mg (1 pill) in the morning and 100 mg (2 pills) at bedtime 05/30/22   Rush Nest, MD    Family History Family History  Problem Relation Age of Onset   Diabetes Mother     Social History Social History   Tobacco Use   Smoking status: Never   Smokeless tobacco: Never  Vaping Use   Vaping status: Never Used  Substance Use Topics   Alcohol use: Yes    Comment: 1 x month   Drug use: No     Allergies   Patient has no known allergies.   Review of Systems Review of Systems  As per HPI  Physical Exam Triage Vital Signs ED Triage Vitals  Encounter Vitals Group     BP 05/25/24 1044 115/78     Girls Systolic BP Percentile --      Girls Diastolic BP Percentile --      Boys Systolic BP Percentile --      Boys Diastolic BP Percentile --      Pulse Rate 05/25/24 1044 98     Resp 05/25/24 1044  18     Temp 05/25/24 1044 97.8 F (36.6 C)     Temp Source 05/25/24 1044 Oral     SpO2 05/25/24 1044 95 %     Weight --      Height --      Head Circumference --      Peak Flow --      Pain Score 05/25/24 1046 8     Pain Loc --      Pain Education --      Exclude from Growth Chart --    No data found.  Updated Vital Signs BP 115/78 (BP Location: Left Arm)   Pulse 98   Temp 97.8 F (36.6 C) (Oral)   Resp 18   LMP 05/20/2024   SpO2 95%   Breastfeeding No    Physical Exam Vitals and nursing note reviewed.  HENT:     Right Ear: Tympanic membrane and ear canal normal.     Left Ear: Tympanic membrane and ear canal normal.     Nose: No rhinorrhea.     Mouth/Throat:     Mouth: Mucous membranes are moist.     Pharynx: Oropharynx is clear. Posterior oropharyngeal erythema present.     Tonsils: No tonsillar exudate. 1+ on the right. 1+ on the left.     Comments: Normal phonation, tolerating secretions  Eyes:     Conjunctiva/sclera: Conjunctivae normal.  Cardiovascular:     Rate and Rhythm: Normal rate and regular rhythm.     Pulses: Normal pulses.      Heart sounds: Normal heart sounds.  Pulmonary:     Effort: Pulmonary effort is normal.     Breath sounds: Normal breath sounds.  Musculoskeletal:     Cervical back: Normal range of motion.  Lymphadenopathy:     Cervical: No cervical adenopathy.  Skin:    General: Skin is warm and dry.  Neurological:     Mental Status: She is alert and oriented to person, place, and time.     UC Treatments / Results  Labs (all labs ordered are listed, but only abnormal results are displayed) Labs Reviewed  POCT RAPID STREP A (OFFICE) - Abnormal; Notable for the following components:      Result Value   Rapid Strep A Screen Positive (*)    All other components within normal limits    EKG  Radiology No results found.  Procedures Procedures (including critical care time)  Medications Ordered in UC Medications - No data to display  Initial Impression / Assessment and Plan / UC Course  I have reviewed the triage vital signs and the nursing notes.  Pertinent labs & imaging results that were available during my care of the patient were reviewed by me and considered in my medical decision making (see chart for details).  Afebrile Rapid strep positive Amoxicillin  twice daily for 10 days Discussed continue other supportive care No questions  Final Clinical Impressions(s) / UC Diagnoses   Final diagnoses:  Strep pharyngitis     Discharge Instructions      Your strep test was positive. Please take the medication for the full 10 days. It's important to finish all 10 days even when you are feeling better. Otherwise the infection can come back worse. Take with food to avoid upset stomach. Make sure to change out your toothbrush! Continue other symptomatic care as needed for pain.  Su prueba de estreptococos dio positivo. Tome el medicamento durante los 10 das completos. Es importante que lo  complete incluso si se siente mejor. De lo contrario, la infeccin [employer] forces logistics/support/administrative officer. Tmelo con  alimentos para Acupuncturist. Asegrese de cambiar su cepillo de dientes! Contine con el tratamiento sintomtico segn sea necesario para el dolor.     ED Prescriptions     Medication Sig Dispense Auth. Provider   amoxicillin  (AMOXIL ) 500 MG capsule Take 1 capsule (500 mg total) by mouth 2 (two) times daily for 10 days. 20 capsule Kristofer Schaffert, [place], PA-C      PDMP not reviewed this encounter.   Jeryl Asberry, PA-C 05/25/24 1157

## 2024-05-25 NOTE — ED Triage Notes (Signed)
 Triage with video interpreter Rogue [phone]  Pt reports 2 days of sore throat and bilateral ear pain- right ear pain is worse. Subjective fever and chills last night. No meds taken today

## 2024-05-25 NOTE — Discharge Instructions (Signed)
 Your strep test was positive. Please take the medication for the full 10 days. It's important to finish all 10 days even when you are feeling better. Otherwise the infection can come back worse. Take with food to avoid upset stomach. Make sure to change out your toothbrush! Continue other symptomatic care as needed for pain.  Su prueba de estreptococos dio positivo. Tome el medicamento durante los 10 das completos. Es importante que lo complete incluso si se siente mejor. De lo contrario, la infeccin [employer] logistics/support/administrative officer. Tmelo con alimentos para Acupuncturist. Asegrese de cambiar su cepillo de dientes! Contine con el tratamiento sintomtico segn sea necesario para el dolor.
